# Patient Record
Sex: Female | Born: 1965
Health system: Southern US, Community
[De-identification: ages and names within clinical notes are randomized; demographics above are authoritative.]

## PROBLEM LIST (undated history)

## (undated) ENCOUNTER — Inpatient Hospital Stay: Admission: EM | Payer: Self-pay | Source: Home / Self Care

## (undated) DIAGNOSIS — M1990 Unspecified osteoarthritis, unspecified site: Secondary | ICD-10-CM

## (undated) DIAGNOSIS — F419 Anxiety disorder, unspecified: Secondary | ICD-10-CM

## (undated) DIAGNOSIS — J302 Other seasonal allergic rhinitis: Secondary | ICD-10-CM

## (undated) DIAGNOSIS — C4490 Unspecified malignant neoplasm of skin, unspecified: Secondary | ICD-10-CM

## (undated) DIAGNOSIS — F32A Depression, unspecified: Secondary | ICD-10-CM

## (undated) DIAGNOSIS — E039 Hypothyroidism, unspecified: Secondary | ICD-10-CM

## (undated) DIAGNOSIS — K219 Gastro-esophageal reflux disease without esophagitis: Secondary | ICD-10-CM

## (undated) DIAGNOSIS — K5190 Ulcerative colitis, unspecified, without complications: Secondary | ICD-10-CM

## (undated) DIAGNOSIS — I10 Essential (primary) hypertension: Principal | ICD-10-CM

## (undated) DIAGNOSIS — F329 Major depressive disorder, single episode, unspecified: Secondary | ICD-10-CM

## (undated) HISTORY — PX: DILATION AND CURETTAGE OF UTERUS: SHX78

## (undated) HISTORY — PX: EYE SURGERY: SHX253

## (undated) HISTORY — PX: ABDOMINAL HYSTERECTOMY: SHX81

## (undated) HISTORY — PX: COLONOSCOPY: SHX174

## (undated) HISTORY — DX: Essential (primary) hypertension: I10

---

## 1998-03-18 ENCOUNTER — Ambulatory Visit (HOSPITAL_COMMUNITY): Admission: RE | Admit: 1998-03-18 | Discharge: 1998-03-18 | Payer: Self-pay | Admitting: *Deleted

## 1998-05-03 ENCOUNTER — Ambulatory Visit (HOSPITAL_COMMUNITY): Admission: RE | Admit: 1998-05-03 | Discharge: 1998-05-03 | Payer: Self-pay | Admitting: Obstetrics and Gynecology

## 1999-06-22 ENCOUNTER — Other Ambulatory Visit: Admission: RE | Admit: 1999-06-22 | Discharge: 1999-06-22 | Payer: Self-pay | Admitting: Obstetrics and Gynecology

## 2000-08-28 ENCOUNTER — Other Ambulatory Visit: Admission: RE | Admit: 2000-08-28 | Discharge: 2000-08-28 | Payer: Self-pay | Admitting: Obstetrics and Gynecology

## 2001-11-25 ENCOUNTER — Other Ambulatory Visit: Admission: RE | Admit: 2001-11-25 | Discharge: 2001-11-25 | Payer: Self-pay | Admitting: Obstetrics and Gynecology

## 2002-12-16 ENCOUNTER — Other Ambulatory Visit: Admission: RE | Admit: 2002-12-16 | Discharge: 2002-12-16 | Payer: Self-pay | Admitting: Obstetrics and Gynecology

## 2004-01-26 ENCOUNTER — Other Ambulatory Visit: Admission: RE | Admit: 2004-01-26 | Discharge: 2004-01-26 | Payer: Self-pay | Admitting: Obstetrics and Gynecology

## 2004-10-12 ENCOUNTER — Observation Stay (HOSPITAL_COMMUNITY): Admission: RE | Admit: 2004-10-12 | Discharge: 2004-10-13 | Payer: Self-pay | Admitting: Obstetrics and Gynecology

## 2004-10-12 ENCOUNTER — Encounter (INDEPENDENT_AMBULATORY_CARE_PROVIDER_SITE_OTHER): Payer: Self-pay | Admitting: *Deleted

## 2005-01-30 ENCOUNTER — Other Ambulatory Visit: Admission: RE | Admit: 2005-01-30 | Discharge: 2005-01-30 | Payer: Self-pay | Admitting: Obstetrics and Gynecology

## 2005-09-25 ENCOUNTER — Ambulatory Visit (HOSPITAL_COMMUNITY): Admission: RE | Admit: 2005-09-25 | Discharge: 2005-09-25 | Payer: Self-pay | Admitting: Family Medicine

## 2009-09-29 ENCOUNTER — Ambulatory Visit (HOSPITAL_COMMUNITY): Admission: RE | Admit: 2009-09-29 | Discharge: 2009-09-29 | Payer: Self-pay | Admitting: Obstetrics and Gynecology

## 2010-05-07 HISTORY — PX: BLADDER SUSPENSION: SHX72

## 2010-07-24 LAB — CBC
HCT: 41.9 % (ref 36.0–46.0)
Hemoglobin: 14.4 g/dL (ref 12.0–15.0)
MCHC: 34.4 g/dL (ref 30.0–36.0)
MCV: 98 fL (ref 78.0–100.0)
Platelets: 261 10*3/uL (ref 150–400)
RBC: 4.27 MIL/uL (ref 3.87–5.11)
RDW: 14 % (ref 11.5–15.5)
WBC: 6.4 10*3/uL (ref 4.0–10.5)

## 2010-09-22 NOTE — Op Note (Signed)
NAMEKARLE, DESROSIER              ACCOUNT NO.:  [id number]   MEDICAL RECORD NO.:  [id number]          PATIENT TYPE:  OBV   LOCATION:  9399                          FACILITY:  [facility]   PHYSICIAN:  Juluis Mire, M.D.   DATE OF BIRTH:  1965-05-10   DATE OF PROCEDURE:  10/12/2004  DATE OF DISCHARGE:                                 OPERATIVE REPORT   PREOPERATIVE DIAGNOSES:  1.  Menorrhagia.  2.  Dysmenorrhea.  3.  Probable uterine adenomyosis.   POSTOPERATIVE DIAGNOSES:  1.  Menorrhagia.  2.  Dysmenorrhea.  3.  Probable uterine adenomyosis.   PROCEDURE:  Laparoscopically-assisted vaginal hysterectomy.   SURGEON:  Juluis Mire, M.D.   ASSISTANT:  Stann Mainland. Vincente Poli, M.D.   ANESTHESIA:  General endotracheal.   ESTIMATED BLOOD LOSS:  200 mL.   PACKS AND DRAINS:  None.   INTRAOPERATIVE BLOOD REPLACED:  None.   COMPLICATIONS:  None.   INDICATIONS:  As dictated in the history and physical.   PROCEDURE:  The patient was taken to the OR and placed in the supine  position.  After an adequate level of general endotracheal anesthesia  obtained, the patient was placed in the dorsal lithotomy position using the  Allen stirrups.  The abdomen, perineum and vagina were prepped out with  Betadine.  The bladder was emptied by in-and-out catheterization.  A Hulka  tenaculum was put in place and secured.  The patient draped in a sterile  field.  A subumbilical incision made with a knife.  A Veress needle was  introduced in the abdominal cavity.  The abdomen was insufflated with  approximately 3 L of carbon dioxide.  The operating laparoscope was  introduced with no evidence of injury to adjacent organs.  A 5 mm trocar was  put in place in the suprapubic area under direct visualization.  The uterus  was upper limits of normal size.  Minimal amounts of endometriosis noted  along the left pelvic sidewall.  The ovaries were unremarkable.  The upper  abdomen, including the liver, tip of the  gallbladder and both lateral  gutters including the appendix, was clear.  Using the Gyrus bipolar, the  right utero-ovarian pedicle was cauterized, incised.  The right tube and  mesosalpinx was cauterized, incised.  The right round ligament was  cauterized, incised, and part of the right broad ligament was separated.  Next we did the left side.  The left utero-ovarian pedicle was cauterized,  incised.  The left tube and mesosalpinx were cauterized, incised.  The left  round ligament was cauterized, incised, and we did extend it down to the  broad ligament.  At this point in time the abdomen was deflated of its  carbon dioxide, the laparoscope was removed, legs were repositioned.  The  Hulka tenaculum was then taken out, a weighted speculum put in.  The cervix  was grasped with a Jacobs tenaculum, the cul-de-sac was entered sharply.  The uterosacral ligaments were clamped, cut, and suture ligated with 0  Vicryl.  The reflection of the vaginal mucosa was then excised using the  Bovie, bladder  was dissected superiorly.  Paracervical tissue as clamped,  cut, and suture ligated with 0 Vicryl.  The vesicouterine space was  identified, entered sharply and retractors put in place to retract the  bladder superiorly.  Using the clamp, cut and tie technique with suture  ligature of 0 Vicryl, the parametrium was serially separated from the sides  of the uterus.  The uterus was then flipped.  The remaining pedicles were  clamped and cut.  Uterus and cervix were passed off the operative field.  Held pedicles were secured with free ties of 0 Vicryl.  A uterosacral  plication stitch of 0 Vicryl was put in place and secured.  The vaginal  mucosa was reapproximated in the midline with interrupted figure-of-eights  of 0 Vicryl.  A sponge on a sponge stick was placed in the vaginal vault.  A  Foley was placed to straight drain with retrieval of an adequate amount of  clear urine.  The weighted speculum had  been removed.  The patient's legs  were repositioned.  The laparoscope was reintroduced as the abdomen was  reinflated with carbon dioxide.  Areas of oozing on the vaginal cuff were  brought under control using the Gyrus.  With this we had good hemostasis at  all pedicles.  We thoroughly irrigated the pelvis.  No active bleeding was  noted.  The abdomen was deflated of its carbon dioxide, all trocars removed.  The subumbilical incision was closed with interrupted subcuticulars of 4-0  Vicryl, the suprapubic incision closed with Dermabond.  The patient taken  out of the dorsal lithotomy position, once alert and extubated transferred  to the recovery room in good condition.  Sponge, instrument and needle count  reported as correct by the circulating nurse x2.       JSM/MEDQ  D:  10/12/2004  T:  10/12/2004  Job:  [id number]

## 2010-09-22 NOTE — Discharge Summary (Signed)
[name], Robin Gardner              ACCOUNT NO.:  [id number]   MEDICAL RECORD NO.:  [id number]          PATIENT TYPE:  OBV   LOCATION:  9307                          FACILITY:  [facility]   PHYSICIAN:  Juluis Mire, M.D.   DATE OF BIRTH:  1965/10/23   DATE OF ADMISSION:  10/12/2004  DATE OF DISCHARGE:  10/13/2004                                 DISCHARGE SUMMARY   ADMITTING DIAGNOSES:  Uterine adenomyosis.   DISCHARGE DIAGNOSES:  Uterine adenomyosis.   OPERATION/PROCEDURE:  Laparoscopic assisted vaginal hysterectomy.   For complete history and physical see dictated note.   HOSPITAL COURSE:  Patient underwent the above noted surgery.  Pathology  pending.  Postoperative hemoglobin 13.3.  Discharged home on her first  postoperative day.  At that time she was tolerating a regular diet and  ambulating.  Her Foley would be discontinue and she was voiding without  difficulty.  There was no active vaginal bleeding.  Abdomen was soft.  Incision was clear and bowel sounds were active.   COMPLICATIONS:  None encountered during stay in hospital.  Patient  discharged home in stable condition.   DISPOSITION:  Routine postoperative instructions/orders given.  She is to  avoid heavy lifting, vaginal entrance, driving of a car.  She is to watch  for signs of infection, nausea, vomiting, increasing abdominal pain, or  active vaginal bleeding.  Discharged home on Percocet as she needs for pain.  Reassess in the office in one week.       JSM/MEDQ  D:  10/13/2004  T:  10/13/2004  Job:  [id number]

## 2010-09-22 NOTE — H&P (Signed)
NAME:  Robin Gardner, Robin Gardner              ACCOUNT NO.:  [id number]   MEDICAL RECORD NO.:  [id number]          PATIENT TYPE:  AMB   LOCATION:  SDC                           FACILITY:  [facility]   PHYSICIAN:  Juluis Mire, M.D.   DATE OF BIRTH:  [birth year]   DATE OF ADMISSION:  10/12/2004  DATE OF DISCHARGE:                                HISTORY & PHYSICAL   HISTORY:  The patient is a 45 year old gravida 3, para 2, abortus 1, married  female who presents for laparoscopic assisted vaginal hysterectomy, _____to  the present admission. Cycles have been regular. She has prolonged episodes  of bleeding. This has become limiting from the patient's stand-point. She  also had break-through bleeding on birth control pills. Some of this may  have been from the medication that she takes for ulcerative colitis. She has  been on Prednisone in the past. We tried using a [place] IUD, however despite  this she had continuous bleeding and pelvic pain. She was being actively  managed for hypertension at point in time and therefore we were unable to  proceed with the use of birth control pills. We did a saline infusion  ultrasound that was basically unremarkable except for the presence of the  IUD and findings consistent with probable adenomyosis. We had discussed the  various options including endometrial ablation versus attempts at other  hormonal management. The patient desires to proceed with laparoscopic  assisted vaginal hysterectomy for which she is admitted at the present time.   ALLERGIES:  No known drug allergies.   MEDICATIONS:  1.  Prednisone 5 mg every other day.  2.  Colazal 750 mg three tablets t.i.d.  3.  Diovan & hydrochlorothiazide 80 mg 1/2 tablet daily.  4.  Colocort 100 mg at h.s. every other day.  5.  Hyoscyamine 0.125 mg 1-2 tablets q.4h p.r.n.   PAST MEDICAL HISTORY:  1.  The usual childhood diseases.  2.  She is being managed for ulcerative colitis.   PAST SURGICAL HISTORY:  She had  one cesarean section and then a vaginal  delivery.   FAMILY HISTORY:  Noncontributory.   SOCIAL HISTORY:  No tobacco or alcohol use.   REVIEW OF SYSTEMS:  Noncontributory.   PHYSICAL EXAMINATION:  VITAL SIGNS:  The patient is afebrile with stable  vital signs.  HEENT:  Normocephalic. Pupils are equal, round and reactive to light and  accommodation.  Extraocular movements are intact. Sclerae and conjunctivae  are clear. Oropharynx clear.  NECK:  Without thyromegaly.  BREASTS:  No discreet masses.  LUNGS:  Clear.  CARDIAC SYSTEM:  Regular rate and rhythm without murmurs or gallops.  ABDOMEN:  Exam is benign. No mass, organomegaly or tenderness.  PELVIC:  Normal external genitalia. Vaginal mucosa is clear. Cervix  unremarkable. Uterus upper limits of normal size. Adnexa unremarkable.  EXTREMITIES:  Trace edema.  NEUROLOGIC EXAM:  Grossly within normal limits.   IMPRESSION:  1.  Menorrhagia with associated pelvic pain, probably adenomyosis.  2.  Hypertension.  3.  Ulcerative colitis.   PLAN:  The patient will undergo laparoscopic assisted vaginal hysterectomy.  Alternatives have been discussed. The risks of surgery have been discussed  including the risk of infection. The risk of hemorrhage. Required  transfusion. The risk of AIDS or hepatitis. The risk of injury to adjacent  organs including bladder, bowel or ureters that could require further  exploratory surgery. The risk of deep venous thrombosis and pulmonary  embolus. The patient expressed her understanding of indications and risks.       JSM/MEDQ  D:  10/12/2004  T:  10/12/2004  Job:  [id number]

## 2011-02-07 ENCOUNTER — Other Ambulatory Visit: Payer: Self-pay | Admitting: Dermatology

## 2011-06-15 ENCOUNTER — Other Ambulatory Visit: Payer: Self-pay | Admitting: Dermatology

## 2011-09-10 ENCOUNTER — Ambulatory Visit (HOSPITAL_COMMUNITY)
Admission: RE | Admit: 2011-09-10 | Discharge: 2011-09-10 | Disposition: A | Discharge status: Home / Self Care | Payer: PRIVATE HEALTH INSURANCE | Source: Ambulatory Visit | Attending: Family Medicine | Admitting: Family Medicine

## 2011-09-10 ENCOUNTER — Other Ambulatory Visit: Payer: Self-pay | Admitting: Family Medicine

## 2011-09-10 DIAGNOSIS — R053 Chronic cough: Secondary | ICD-10-CM

## 2011-09-10 DIAGNOSIS — R05 Cough: Secondary | ICD-10-CM

## 2011-09-10 DIAGNOSIS — I10 Essential (primary) hypertension: Secondary | ICD-10-CM | POA: Insufficient documentation

## 2011-09-10 DIAGNOSIS — R059 Cough, unspecified: Secondary | ICD-10-CM | POA: Insufficient documentation

## 2012-02-18 ENCOUNTER — Other Ambulatory Visit: Payer: Self-pay | Admitting: Gastroenterology

## 2012-02-21 ENCOUNTER — Emergency Department (HOSPITAL_COMMUNITY): Payer: PRIVATE HEALTH INSURANCE

## 2012-02-21 ENCOUNTER — Emergency Department (HOSPITAL_COMMUNITY)
Admission: EM | Admit: 2012-02-21 | Discharge: 2012-02-21 | Disposition: A | Discharge status: Home / Self Care | Payer: PRIVATE HEALTH INSURANCE | Attending: Emergency Medicine | Admitting: Emergency Medicine

## 2012-02-21 ENCOUNTER — Encounter (HOSPITAL_COMMUNITY): Payer: Self-pay | Admitting: Nurse Practitioner

## 2012-02-21 DIAGNOSIS — R109 Unspecified abdominal pain: Secondary | ICD-10-CM | POA: Insufficient documentation

## 2012-02-21 DIAGNOSIS — K5190 Ulcerative colitis, unspecified, without complications: Secondary | ICD-10-CM | POA: Insufficient documentation

## 2012-02-21 DIAGNOSIS — Z9889 Other specified postprocedural states: Secondary | ICD-10-CM | POA: Insufficient documentation

## 2012-02-21 DIAGNOSIS — K625 Hemorrhage of anus and rectum: Secondary | ICD-10-CM | POA: Insufficient documentation

## 2012-02-21 HISTORY — DX: Ulcerative colitis, unspecified, without complications: K51.90

## 2012-02-21 HISTORY — DX: Unspecified malignant neoplasm of skin, unspecified: C44.90

## 2012-02-21 LAB — CBC WITH DIFFERENTIAL/PLATELET
Eosinophils Absolute: 0.2 10*3/uL (ref 0.0–0.7)
Eosinophils Relative: 3 % (ref 0–5)
HCT: 39.3 % (ref 36.0–46.0)
Lymphs Abs: 0.8 10*3/uL (ref 0.7–4.0)
MCH: 31.3 pg (ref 26.0–34.0)
MCV: 91 fL (ref 78.0–100.0)
Monocytes Absolute: 0.7 10*3/uL (ref 0.1–1.0)
Monocytes Relative: 10 % (ref 3–12)
Platelets: 423 10*3/uL — ABNORMAL HIGH (ref 150–400)
RBC: 4.32 MIL/uL (ref 3.87–5.11)

## 2012-02-21 LAB — COMPREHENSIVE METABOLIC PANEL
BUN: 9 mg/dL (ref 6–23)
Calcium: 9.1 mg/dL (ref 8.4–10.5)
Creatinine, Ser: 0.69 mg/dL (ref 0.50–1.10)
GFR calc Af Amer: >90 mL/min (ref >90)
GFR calc non Af Amer: >90 mL/min (ref >90)
Glucose, Bld: 90 mg/dL (ref 70–99)
Sodium: 140 mEq/L (ref 135–145)
Total Protein: 7.5 g/dL (ref 6.0–8.3)

## 2012-02-21 LAB — POCT PREGNANCY, URINE: Preg Test, Ur: NEGATIVE

## 2012-02-21 NOTE — ED Notes (Signed)
Patient transported to X-ray 

## 2012-02-21 NOTE — ED Notes (Signed)
AIDET performed. 

## 2012-02-21 NOTE — ED Notes (Signed)
Pt states that on Monday she went in for a colonoscopy. Since then when she has a bowel movement she is actually passing mucus, frank blood, and some clots. Abdomen is tender to touch. LBM was this morning.

## 2012-02-21 NOTE — ED Notes (Signed)
Pt had endo/colonoscopy at [facility] on Monday for h/o ulcerative colitis. Reports since Tuesday constant abd pain and intermittent bright red blood with clots per rectum.

## 2012-02-21 NOTE — ED Provider Notes (Signed)
History     CSN: [id number]  Arrival date & time 02/21/12  1221   First MD Initiated Contact with Patient 02/21/12 1244      Chief Complaint  Patient presents with  . Rectal Bleeding  . Abdominal Pain    (Consider location/radiation/quality/duration/timing/severity/associated sxs/prior treatment) HPI Comments: Robin Gardner 46 y.o. female   The chief complaint is: Patient presents with:   Rectal Bleeding   Abdominal Pain    46 year old female presents to the emergency department. Status post colonoscopy on Monday, 02/18/2012. Patient states that since that time. She has had crampy, lower abdominal pain, and rectal bleeding with bright red blood. She is also been passing clots. She states that this is intermittent. She has a history of ulcerative colitis. States that her bowel movements have been normal. "For her." She states that stools are usually loose and have been such. She denies, fevers, chills, nausea, vomiting. She denies, chest pain, shortness of breath. She denies vaginal symptoms. She denies urinary symptoms. This is unlike previous flares of ulcerative colitis. Denies, lightheadedness, or racing or skipping of the heart. Sent by GI to rule out perforation of the abdomen.     The history is provided by the patient. No language interpreter was used.    Past Medical History  Diagnosis Date  . Ulcerative colitis   . Skin cancer     Past Surgical History  Procedure Date  . Abdominal hysterectomy     History reviewed. No pertinent family history.  History  Substance Use Topics  . Smoking status: Never Smoker   . Smokeless tobacco: Not on file  . Alcohol Use: Yes    OB History    Grav Para Term Preterm Abortions TAB SAB Ect Mult Living                  Review of Systems  Constitutional: Negative for fever and chills.  HENT: Negative for trouble swallowing.   Respiratory: Negative for chest tightness and shortness of breath.   Cardiovascular:  Negative for chest pain and palpitations.  Gastrointestinal: Positive for abdominal pain and anal bleeding. Negative for diarrhea and rectal pain.  Genitourinary: Negative for dysuria, frequency, hematuria, vaginal bleeding, vaginal discharge, vaginal pain and pelvic pain.  Musculoskeletal: Negative for myalgias and arthralgias.  Skin: Negative for rash.  Neurological: Negative for light-headedness and headaches.  All other systems reviewed and are negative.    Allergies  Augmentin and Adhesive  Home Medications   Current Outpatient Rx  Name Route Sig Dispense Refill  . VITAMIN B-COMPLEX PO TABS Oral Take 1 tablet by mouth daily.    Marland Kitchen VITAMIN D 1000 UNITS PO TABS Oral Take 1,000 Units by mouth daily.    Marland Kitchen HYDROCORTISONE 100 MG/60ML RE ENEM Rectal Place 100 mg rectally at bedtime. For 2 weeks starting on 02/19/12    . LEVOTHYROXINE SODIUM 75 MCG PO TABS Oral Take 75 mcg by mouth daily.    . MERCAPTOPURINE 50 MG PO TABS Oral Take 100 mg by mouth daily. Give on an empty stomach 1 hour before or 2 hours after meals. Caution: Chemotherapy.    . ONDANSETRON HCL 4 MG PO TABS Oral Take 4 mg by mouth every 8 (eight) hours as needed. For nausea    . PANTOPRAZOLE SODIUM 40 MG PO TBEC Oral Take 40 mg by mouth daily.    Marland Kitchen HEALTHY COLON PO Oral Take 1 tablet by mouth daily.    . SERTRALINE HCL 50 MG PO TABS  Oral Take 50 mg by mouth daily.      BP 152/88  Pulse 70  Temp 98.1 F (36.7 C) (Oral)  Resp 20  SpO2 98%  Physical Exam  Nursing note and vitals reviewed. Constitutional: She is oriented to person, place, and time. She appears well-developed and well-nourished. No distress.  HENT:  Head: Normocephalic and atraumatic.  Eyes: Conjunctivae normal are normal. No scleral icterus.  Neck: Normal range of motion.  Cardiovascular: Normal rate, regular rhythm and normal heart sounds.  Exam reveals no gallop and no friction rub.   No murmur heard. Pulmonary/Chest: Effort normal and breath  sounds normal. No respiratory distress.  Abdominal: Soft. Bowel sounds are normal. She exhibits no distension and no mass. There is no tenderness. There is no guarding.  Musculoskeletal: Normal range of motion.  Neurological: She is alert and oriented to person, place, and time.  Skin: Skin is warm and dry. She is not diaphoretic.    ED Course  Procedures (including critical care time) Results for orders placed during the hospital encounter of 02/21/12  CBC WITH DIFFERENTIAL      Component Value Range   WBC 7.4  4.0 - 10.5 K/uL   RBC 4.32  3.87 - 5.11 MIL/uL   Hemoglobin 13.5  12.0 - 15.0 g/dL   HCT 40.9  81.1 - 91.4 %   MCV 91.0  78.0 - 100.0 fL   MCH 31.3  26.0 - 34.0 pg   MCHC 34.4  30.0 - 36.0 g/dL   RDW 78.2  95.6 - 21.3 %   Platelets 423 (*) 150 - 400 K/uL   Neutrophils Relative 76  43 - 77 %   Neutro Abs 5.6  1.7 - 7.7 K/uL   Lymphocytes Relative 11 (*) 12 - 46 %   Lymphs Abs 0.8  0.7 - 4.0 K/uL   Monocytes Relative 10  3 - 12 %   Monocytes Absolute 0.7  0.1 - 1.0 K/uL   Eosinophils Relative 3  0 - 5 %   Eosinophils Absolute 0.2  0.0 - 0.7 K/uL   Basophils Relative 0  0 - 1 %   Basophils Absolute 0.0  0.0 - 0.1 K/uL  COMPREHENSIVE METABOLIC PANEL      Component Value Range   Sodium 140  135 - 145 mEq/L   Potassium 3.6  3.5 - 5.1 mEq/L   Chloride 103  96 - 112 mEq/L   CO2 26  19 - 32 mEq/L   Glucose, Bld 90  70 - 99 mg/dL   BUN 9  6 - 23 mg/dL   Creatinine, Ser 0.86  0.50 - 1.10 mg/dL   Calcium 9.1  8.4 - 57.8 mg/dL   Total Protein 7.5  6.0 - 8.3 g/dL   Albumin 3.9  3.5 - 5.2 g/dL   AST 21  0 - 37 U/L   ALT 20  0 - 35 U/L   Alkaline Phosphatase 74  39 - 117 U/L   Total Bilirubin 0.2 (*) 0.3 - 1.2 mg/dL   GFR calc non Af Amer >90  >90 mL/min   GFR calc Af Amer >90  >90 mL/min  POCT PREGNANCY, URINE      Component Value Range   Preg Test, Ur NEGATIVE  NEGATIVE  POCT PREGNANCY, URINE      Component Value Range   Preg Test, Ur NEGATIVE  NEGATIVE     No  results found.   1. Abdominal  pain, other specified site  2. Rectal bleeding   3. S/P colonoscopy       MDM  Patient with 2 poct preg tests.  1 pos, 1 neg.  Hysterectomy done in 2006.     Patient with negative acute abdominal films. No perforations seen. Bleeding likely from biopsy sites. Will d/c and patient may f/u with GI. Discussed reasons to seek immediate care. Patient expresses understanding and agrees with plan.         Arthor Captain, PA-C 03/03/12 2057

## 2012-03-04 NOTE — ED Provider Notes (Signed)
Medical screening examination/treatment/procedure(s) were performed by non-physician practitioner and as supervising physician I was immediately available for consultation/collaboration.  Ethelda Chick, MD 03/04/12 1200

## 2012-06-26 ENCOUNTER — Other Ambulatory Visit (HOSPITAL_COMMUNITY): Payer: Self-pay | Admitting: Family Medicine

## 2012-06-26 ENCOUNTER — Ambulatory Visit (HOSPITAL_COMMUNITY)
Admission: RE | Admit: 2012-06-26 | Discharge: 2012-06-26 | Disposition: A | Discharge status: Home / Self Care | Payer: BC Managed Care – PPO | Source: Ambulatory Visit | Attending: Family Medicine | Admitting: Family Medicine

## 2012-06-26 DIAGNOSIS — M545 Low back pain, unspecified: Secondary | ICD-10-CM

## 2012-07-22 ENCOUNTER — Telehealth: Payer: Self-pay | Admitting: Family Medicine

## 2012-07-22 NOTE — Telephone Encounter (Signed)
Pt had xray on 2-20. Results in epic.please review

## 2012-07-22 NOTE — Telephone Encounter (Signed)
X ray was completely normal. If still experiencing sig pain, shoulc sched a f-u visit.

## 2012-07-22 NOTE — Telephone Encounter (Signed)
Pt notified of xray result. She will call back to make appointment.

## 2012-07-22 NOTE — Telephone Encounter (Signed)
Pt is checking on her xray for her lower back

## 2012-08-19 ENCOUNTER — Other Ambulatory Visit: Payer: Self-pay | Admitting: *Deleted

## 2012-08-19 MED ORDER — LEVOTHYROXINE SODIUM 75 MCG PO TABS: 75.0000 ug | ORAL_TABLET | Freq: Every day | ORAL | Status: DC

## 2012-10-15 ENCOUNTER — Encounter: Payer: Self-pay | Admitting: Family Medicine

## 2012-10-15 ENCOUNTER — Ambulatory Visit (INDEPENDENT_AMBULATORY_CARE_PROVIDER_SITE_OTHER): Payer: BC Managed Care – PPO | Admitting: Family Medicine

## 2012-10-15 VITALS — BP 122/86 | Temp 98.6°F | Wt 192.0 lb

## 2012-10-15 DIAGNOSIS — J019 Acute sinusitis, unspecified: Secondary | ICD-10-CM

## 2012-10-15 MED ORDER — LEVOFLOXACIN 500 MG PO TABS: 500.0000 mg | ORAL_TABLET | Freq: Every day | ORAL | Status: DC

## 2012-10-15 NOTE — Patient Instructions (Addendum)
DASH Diet  The DASH diet stands for "Dietary Approaches to Stop Hypertension." It is a healthy eating plan that has been shown to reduce high blood pressure (hypertension) in as little as 14 days, while also possibly providing other significant health benefits. These other health benefits include reducing the risk of breast cancer after menopause and reducing the risk of type 2 diabetes, heart disease, colon cancer, and stroke. Health benefits also include weight loss and slowing kidney failure in patients with chronic kidney disease.   DIET GUIDELINES  · Limit salt (sodium). Your diet should contain less than 1500 mg of sodium daily.  · Limit refined or processed carbohydrates. Your diet should include mostly whole grains. Desserts and added sugars should be used sparingly.  · Include small amounts of heart-healthy fats. These types of fats include nuts, oils, and tub margarine. Limit saturated and trans fats. These fats have been shown to be harmful in the body.  CHOOSING FOODS   The following food groups are based on a 2000 calorie diet. See your Registered Dietitian for individual calorie needs.  Grains and Grain Products (6 to 8 servings daily)  · Eat More Often: Whole-wheat bread, brown rice, whole-grain or wheat pasta, quinoa, popcorn without added fat or salt (air popped).  · Eat Less Often: White bread, white pasta, white rice, cornbread.  Vegetables (4 to 5 servings daily)  · Eat More Often: Fresh, frozen, and canned vegetables. Vegetables may be raw, steamed, roasted, or grilled with a minimal amount of fat.  · Eat Less Often/Avoid: Creamed or fried vegetables. Vegetables in a cheese sauce.  Fruit (4 to 5 servings daily)  · Eat More Often: All fresh, canned (in natural juice), or frozen fruits. Dried fruits without added sugar. One hundred percent fruit juice (½ cup [237 mL] daily).  · Eat Less Often: Dried fruits with added sugar. Canned fruit in light or heavy syrup.  Lean Meats, Fish, and Poultry (2  servings or less daily. One serving is 3 to 4 oz [85-114 g]).  · Eat More Often: Ninety percent or leaner ground beef, tenderloin, sirloin. Round cuts of beef, chicken breast, turkey breast. All fish. Grill, bake, or broil your meat. Nothing should be fried.  · Eat Less Often/Avoid: Fatty cuts of meat, turkey, or chicken leg, thigh, or wing. Fried cuts of meat or fish.  Dairy (2 to 3 servings)  · Eat More Often: Low-fat or fat-free milk, low-fat plain or light yogurt, reduced-fat or part-skim cheese.  · Eat Less Often/Avoid: Milk (whole, 2%). Whole milk yogurt. Full-fat cheeses.  Nuts, Seeds, and Legumes (4 to 5 servings per week)  · Eat More Often: All without added salt.  · Eat Less Often/Avoid: Salted nuts and seeds, canned beans with added salt.  Fats and Sweets (limited)  · Eat More Often: Vegetable oils, tub margarines without trans fats, sugar-free gelatin. Mayonnaise and salad dressings.  · Eat Less Often/Avoid: Coconut oils, palm oils, butter, stick margarine, cream, half and half, cookies, candy, pie.  FOR MORE INFORMATION  The Dash Diet Eating Plan: www.dashdiet.org  Document Released: 04/12/2011 Document Revised: 07/16/2011 Document Reviewed: 04/12/2011  [employer]® Patient Information ©2014 [employer].

## 2012-10-15 NOTE — Progress Notes (Signed)
  Subjective:    Patient ID: Robin Gardner, female    DOB: Oct 09, 1965, 47 y.o.   MRN: [id number]  Otalgia  There is pain in the left ear. This is a new problem. The current episode started today. The problem has been gradually improving. Associated symptoms include coughing, headaches, hearing loss and a sore throat.      Review of Systems  HENT: Positive for hearing loss, ear pain and sore throat.   Respiratory: Positive for cough.   Neurological: Positive for headaches.  Patient multiple days of head congestion drainage sinus pressure not feeling good with left ear pain discomfort denies wheezing shortness of breath. PMH benign does not smoke.     Objective:   Physical Exam  On today's exam left eardrum normal right eardrums normal moderate sinus tenderness throat is normal neck is supple lungs are clear heart is regular.      Assessment & Plan:  Acute sinusitis-antibiotics prescribed. Followup if progressive troubles warm. Warning signs were discussed. Should be gradually better over the course of next several days. Should be perfectly fine to do her foot surgery in the near future.

## 2012-11-03 ENCOUNTER — Telehealth: Payer: Self-pay | Admitting: Family Medicine

## 2012-11-03 NOTE — Telephone Encounter (Signed)
error 

## 2012-11-05 ENCOUNTER — Other Ambulatory Visit: Payer: Self-pay | Admitting: Podiatry

## 2012-11-06 ENCOUNTER — Encounter: Payer: Self-pay | Admitting: Nurse Practitioner

## 2012-11-06 ENCOUNTER — Ambulatory Visit (INDEPENDENT_AMBULATORY_CARE_PROVIDER_SITE_OTHER): Payer: BC Managed Care – PPO | Admitting: Nurse Practitioner

## 2012-11-06 VITALS — BP 122/80 | HR 70 | Wt 189.2 lb

## 2012-11-06 DIAGNOSIS — K219 Gastro-esophageal reflux disease without esophagitis: Secondary | ICD-10-CM

## 2012-11-06 DIAGNOSIS — E039 Hypothyroidism, unspecified: Secondary | ICD-10-CM

## 2012-11-06 DIAGNOSIS — J309 Allergic rhinitis, unspecified: Secondary | ICD-10-CM

## 2012-11-06 DIAGNOSIS — Z0000 Encounter for general adult medical examination without abnormal findings: Secondary | ICD-10-CM

## 2012-11-06 DIAGNOSIS — J300 Vasomotor rhinitis: Secondary | ICD-10-CM

## 2012-11-06 MED ORDER — FLUTICASONE PROPIONATE 50 MCG/ACT NA SUSP: 2.0000 | Freq: Every day | NASAL | Status: DC

## 2012-11-06 NOTE — Patient Instructions (Addendum)
OTC antihistamine Zaditor eye drops

## 2012-11-06 NOTE — Addendum Note (Signed)
Addended by: Agnes Brightbill on: 11/06/2012 08:31 AM   Modules accepted: Orders  

## 2012-11-09 DIAGNOSIS — E039 Hypothyroidism, unspecified: Secondary | ICD-10-CM | POA: Insufficient documentation

## 2012-11-09 DIAGNOSIS — K219 Gastro-esophageal reflux disease without esophagitis: Secondary | ICD-10-CM | POA: Insufficient documentation

## 2012-11-09 NOTE — Progress Notes (Signed)
Subjective:  Presents with request for recheck in her thyroid level. No difficulty swallowing. Slight tickle in her throat. Postnasal drainage. Clearing her throat a lot. Reflux well controlled on Protonix. No fever. No abdominal pain. Occasional cough mainly in the morning and at nighttime. No sore throat. No ear pain. No headache. No wheezing. Itchy watery eyes. Minimal caffeine intake. Nonsmoker. No alcohol use. Objective:   BP 122/80  Pulse 70  Wt 189 lb 4 oz (85.843 kg) NAD. Alert, oriented. Left TM obscured with cerumen. Right TM clear effusion. Pharynx nonerythematous with PND noted. Neck supple with mild soft nontender adenopathy. Lungs clear. Heart regular rate rhythm. Abdomen soft nondistended nontender. Thyroid normal limit to palpation and nontender.  Assessment:GERD (gastroesophageal reflux disease)  Vasomotor rhinitis  Hypothyroidism - Plan: TSH, TSH  Routine general medical examination at a health care facility - Plan: Basic metabolic panel, Lipid panel, Basic metabolic panel, Lipid panel  Plan: Meds ordered this encounter  Medications  . fluticasone (FLONASE) 50 MCG/ACT nasal spray    Sig: Place 2 sprays into the nose daily.    Dispense:  16 g    Refill:  5    Order Specific Question:  Supervising Provider    Answer:  Merlyn Albert [[id number]]   OTC antihistamines and Zaditor eyedrops as directed. Continue Protonix as directed. Routine lab work ordered. Recheck here in a few months, call back sooner if any problems.

## 2012-11-09 NOTE — Assessment & Plan Note (Signed)
Labwork pending  

## 2012-11-09 NOTE — Assessment & Plan Note (Signed)
Symptoms stable on Protonix.

## 2012-11-13 ENCOUNTER — Encounter (HOSPITAL_COMMUNITY): Payer: Self-pay | Admitting: Pharmacy Technician

## 2012-11-14 LAB — LIPID PANEL
Cholesterol: 189 mg/dL (ref 0–200)
HDL: 30 mg/dL — ABNORMAL LOW (ref >39)
Total CHOL/HDL Ratio: 6.3 Ratio

## 2012-11-14 LAB — BASIC METABOLIC PANEL
Calcium: 9.1 mg/dL (ref 8.4–10.5)
Creat: 0.79 mg/dL (ref 0.50–1.10)
Sodium: 139 mEq/L (ref 135–145)

## 2012-11-14 LAB — TSH: TSH: 2.882 u[IU]/mL (ref 0.350–4.500)

## 2012-11-18 ENCOUNTER — Other Ambulatory Visit (HOSPITAL_COMMUNITY): Payer: PRIVATE HEALTH INSURANCE

## 2012-11-18 ENCOUNTER — Ambulatory Visit (HOSPITAL_COMMUNITY): Payer: PRIVATE HEALTH INSURANCE | Admitting: Physical Therapy

## 2012-11-18 ENCOUNTER — Encounter: Payer: Self-pay | Admitting: Nurse Practitioner

## 2012-11-18 NOTE — Patient Instructions (Addendum)
    ATHENE SCHUHMACHER  11/18/2012   Your procedure is scheduled on:  11/27/2012  Report to [facility] at  615  AM.  Call this number if you have problems the morning of surgery: [phone]   Remember:   Do not eat food or drink liquids after midnight.   Take these medicines the morning of surgery with A SIP OF WATER: synthroid, mercaptopurine,zofran, protonix, zolft. Take your flonase before you come.   Do not wear jewelry, make-up or nail polish.  Do not wear lotions, powders, or perfumes.   Do not shave 48 hours prior to surgery. Men may shave face and neck.  Do not bring valuables to the hospital.  [facility] is not responsible for any belongings or valuables.  Contacts, dentures or bridgework may not be worn into surgery.  Leave suitcase in the car. After surgery it may be brought to your room.  For patients admitted to the hospital, checkout time is 11:00 AM the day of discharge.   Patients discharged the day of surgery will not be allowed to drive home.  Name and phone number of your driver: family Special Instructions: Shower using CHG 2 nights before surgery and the night before surgery.  If you shower the day of surgery use CHG.  Use special wash - you have one bottle of CHG for all showers.  You should use approximately 1/3 of the bottle for each shower.   Please read over the following fact sheets that you were given: Pain Booklet, Coughing and Deep Breathing, Surgical Site Infection Prevention, Anesthesia Post-op Instructions and Care and Recovery After Surgery PATIENT INSTRUCTIONS POST-ANESTHESIA  IMMEDIATELY FOLLOWING SURGERY:  Do not drive or operate machinery for the first twenty four hours after surgery.  Do not make any important decisions for twenty four hours after surgery or while taking narcotic pain medications or sedatives.  If you develop intractable nausea and vomiting or a severe headache please notify your doctor immediately.  FOLLOW-UP:  Please make an  appointment with your surgeon as instructed. You do not need to follow up with anesthesia unless specifically instructed to do so.  WOUND CARE INSTRUCTIONS (if applicable):  Keep a dry clean dressing on the anesthesia/puncture wound site if there is drainage.  Once the wound has quit draining you may leave it open to air.  Generally you should leave the bandage intact for twenty four hours unless there is drainage.  If the epidural site drains for more than 36-48 hours please call the anesthesia department.  QUESTIONS?:  Please feel free to call your physician or the hospital operator if you have any questions, and they will be happy to assist you.

## 2012-11-19 ENCOUNTER — Ambulatory Visit (HOSPITAL_COMMUNITY): Payer: BC Managed Care – PPO | Admitting: Physical Therapy

## 2012-11-19 ENCOUNTER — Encounter (HOSPITAL_COMMUNITY): Payer: Self-pay

## 2012-11-19 ENCOUNTER — Ambulatory Visit (HOSPITAL_COMMUNITY)
Admission: RE | Admit: 2012-11-19 | Discharge: 2012-11-19 | Disposition: A | Discharge status: Home / Self Care | Payer: BC Managed Care – PPO | Source: Ambulatory Visit | Attending: Podiatry | Admitting: Podiatry

## 2012-11-19 ENCOUNTER — Encounter (HOSPITAL_COMMUNITY)
Admission: RE | Admit: 2012-11-19 | Discharge: 2012-11-19 | Disposition: A | Discharge status: Home / Self Care | Payer: BC Managed Care – PPO | Source: Ambulatory Visit | Attending: Podiatry | Admitting: Podiatry

## 2012-11-19 HISTORY — DX: Gastro-esophageal reflux disease without esophagitis: K21.9

## 2012-11-19 HISTORY — DX: Hypothyroidism, unspecified: E03.9

## 2012-11-19 HISTORY — DX: Other seasonal allergic rhinitis: J30.2

## 2012-11-19 LAB — HEMOGLOBIN AND HEMATOCRIT, BLOOD
HCT: 40.5 % (ref 36.0–46.0)
Hemoglobin: 13.8 g/dL (ref 12.0–15.0)

## 2012-11-27 ENCOUNTER — Ambulatory Visit (HOSPITAL_COMMUNITY)
Admission: RE | Admit: 2012-11-27 | Discharge: 2012-11-27 | Disposition: A | Discharge status: Home / Self Care | Payer: BC Managed Care – PPO | Source: Ambulatory Visit | Attending: Podiatry | Admitting: Podiatry

## 2012-11-27 ENCOUNTER — Encounter (HOSPITAL_COMMUNITY): Payer: Self-pay | Admitting: Anesthesiology

## 2012-11-27 ENCOUNTER — Encounter (HOSPITAL_COMMUNITY)
Admission: RE | Disposition: A | Discharge status: Home / Self Care | Payer: Self-pay | Source: Ambulatory Visit | Attending: Podiatry

## 2012-11-27 ENCOUNTER — Ambulatory Visit (HOSPITAL_COMMUNITY): Payer: BC Managed Care – PPO

## 2012-11-27 ENCOUNTER — Encounter (HOSPITAL_COMMUNITY): Payer: Self-pay | Admitting: *Deleted

## 2012-11-27 ENCOUNTER — Ambulatory Visit (HOSPITAL_COMMUNITY): Payer: BC Managed Care – PPO | Admitting: Anesthesiology

## 2012-11-27 DIAGNOSIS — M2010 Hallux valgus (acquired), unspecified foot: Secondary | ICD-10-CM | POA: Insufficient documentation

## 2012-11-27 DIAGNOSIS — Z01812 Encounter for preprocedural laboratory examination: Secondary | ICD-10-CM | POA: Insufficient documentation

## 2012-11-27 DIAGNOSIS — M24573 Contracture, unspecified ankle: Secondary | ICD-10-CM | POA: Insufficient documentation

## 2012-11-27 DIAGNOSIS — M2011 Hallux valgus (acquired), right foot: Secondary | ICD-10-CM

## 2012-11-27 HISTORY — PX: OSTECTOMY: SHX6439

## 2012-11-27 HISTORY — PX: AIKEN OSTEOTOMY: SHX6331

## 2012-11-27 SURGERY — OSTECTOMY
Anesthesia: Monitor Anesthesia Care | Site: Foot | Laterality: Right | Wound class: Clean

## 2012-11-27 MED ORDER — MIDAZOLAM HCL 2 MG/2ML IJ SOLN
INTRAMUSCULAR | Status: AC
  Filled 2012-11-27: qty 2

## 2012-11-27 MED ORDER — MIDAZOLAM HCL 2 MG/2ML IJ SOLN
1.0000 mg | INTRAMUSCULAR | Status: DC | PRN
  Administered 2012-11-27: 2 mg via INTRAVENOUS

## 2012-11-27 MED ORDER — FENTANYL CITRATE 0.05 MG/ML IJ SOLN
INTRAMUSCULAR | Status: AC
  Filled 2012-11-27: qty 2

## 2012-11-27 MED ORDER — ONDANSETRON HCL 4 MG/2ML IJ SOLN
INTRAMUSCULAR | Status: AC
  Filled 2012-11-27: qty 2

## 2012-11-27 MED ORDER — FENTANYL CITRATE 0.05 MG/ML IJ SOLN
25.0000 ug | INTRAMUSCULAR | Status: AC
  Administered 2012-11-27 (×2): 25 ug via INTRAVENOUS

## 2012-11-27 MED ORDER — MIDAZOLAM HCL 5 MG/5ML IJ SOLN
INTRAMUSCULAR | Status: DC | PRN
  Administered 2012-11-27 (×2): 1 mg via INTRAVENOUS
  Administered 2012-11-27: 2 mg via INTRAVENOUS

## 2012-11-27 MED ORDER — PROPOFOL INFUSION 10 MG/ML OPTIME
INTRAVENOUS | Status: DC | PRN
  Administered 2012-11-27: 30 ug/kg/min via INTRAVENOUS
  Administered 2012-11-27: 35 ug/kg/min via INTRAVENOUS

## 2012-11-27 MED ORDER — CLINDAMYCIN PHOSPHATE 600 MG/50ML IV SOLN
600.0000 mg | Freq: Once | INTRAVENOUS | Status: AC
  Administered 2012-11-27: 900 mg via INTRAVENOUS

## 2012-11-27 MED ORDER — ONDANSETRON HCL 4 MG/2ML IJ SOLN: 4.0000 mg | Freq: Once | INTRAMUSCULAR | Status: DC | PRN

## 2012-11-27 MED ORDER — ONDANSETRON HCL 4 MG/2ML IJ SOLN: 4.0000 mg | Freq: Once | INTRAMUSCULAR | Status: DC

## 2012-11-27 MED ORDER — PROPOFOL 10 MG/ML IV EMUL
INTRAVENOUS | Status: AC
  Filled 2012-11-27: qty 20

## 2012-11-27 MED ORDER — FENTANYL CITRATE 0.05 MG/ML IJ SOLN
INTRAMUSCULAR | Status: DC | PRN
  Administered 2012-11-27: 50 ug via INTRAVENOUS
  Administered 2012-11-27 (×2): 25 ug via INTRAVENOUS

## 2012-11-27 MED ORDER — FENTANYL CITRATE 0.05 MG/ML IJ SOLN
25.0000 ug | INTRAMUSCULAR | Status: DC | PRN
  Administered 2012-11-27 (×2): 50 ug via INTRAVENOUS

## 2012-11-27 MED ORDER — SODIUM CHLORIDE 0.9 % IR SOLN
Status: DC | PRN
  Administered 2012-11-27: 1000 mL

## 2012-11-27 MED ORDER — CLINDAMYCIN PHOSPHATE 600 MG/50ML IV SOLN
INTRAVENOUS | Status: AC
  Filled 2012-11-27: qty 50

## 2012-11-27 MED ORDER — BUPIVACAINE HCL (PF) 0.5 % IJ SOLN
INTRAMUSCULAR | Status: DC | PRN
  Administered 2012-11-27: 20 mL

## 2012-11-27 MED ORDER — LACTATED RINGERS IV SOLN
INTRAVENOUS | Status: DC
  Administered 2012-11-27: 07:00:00 via INTRAVENOUS

## 2012-11-27 MED ORDER — BUPIVACAINE HCL (PF) 0.5 % IJ SOLN
INTRAMUSCULAR | Status: AC
  Filled 2012-11-27: qty 30

## 2012-11-27 SURGICAL SUPPLY — 68 items
APL SKNCLS STERI-STRIP NONHPOA (GAUZE/BANDAGES/DRESSINGS) ×1
BAG HAMPER (MISCELLANEOUS) ×2 IMPLANT
BANDAGE CONFORM 2  STR LF (GAUZE/BANDAGES/DRESSINGS) ×2 IMPLANT
BANDAGE ELASTIC 4 VELCRO NS (GAUZE/BANDAGES/DRESSINGS) ×2 IMPLANT
BANDAGE ESMARK 4X12 BL STRL LF (DISPOSABLE) ×1 IMPLANT
BANDAGE GAUZE ELAST BULKY 4 IN (GAUZE/BANDAGES/DRESSINGS) ×2 IMPLANT
BENZOIN TINCTURE PRP APPL 2/3 (GAUZE/BANDAGES/DRESSINGS) ×2 IMPLANT
BIT DRILL CANN 2.7 (BIT) ×2
BIT DRILL SRG 2.7XCANN AO CPLG (BIT) IMPLANT
BIT DRL SRG 2.7XCANN AO CPLNG (BIT) ×1
BLADE AVERAGE 25X9 (BLADE) ×2 IMPLANT
BLADE SURG 15 STRL LF DISP TIS (BLADE) ×2 IMPLANT
BLADE SURG 15 STRL SS (BLADE) ×8
BNDG CMPR 12X4 ELC STRL LF (DISPOSABLE) ×1
BNDG ESMARK 4X12 BLUE STRL LF (DISPOSABLE) ×2
CHLORAPREP W/TINT 26ML (MISCELLANEOUS) ×2 IMPLANT
CLIP EZ FIXATION 10X10X10 (Staple) ×1 IMPLANT
CLOTH BEACON ORANGE TIMEOUT ST (SAFETY) ×2 IMPLANT
COVER LIGHT HANDLE STERIS (MISCELLANEOUS) ×4 IMPLANT
CUFF TOURNIQUET SINGLE 18IN (TOURNIQUET CUFF) ×1 IMPLANT
DECANTER SPIKE VIAL GLASS SM (MISCELLANEOUS) ×2 IMPLANT
DRAPE OEC MINIVIEW 54X84 (DRAPES) ×2 IMPLANT
DRSG ADAPTIC 3X8 NADH LF (GAUZE/BANDAGES/DRESSINGS) ×2 IMPLANT
DURA STEPPER LG (CAST SUPPLIES) IMPLANT
DURA STEPPER MED (CAST SUPPLIES) ×1 IMPLANT
DURA STEPPER SML (CAST SUPPLIES) IMPLANT
DURA STEPPER XL (SOFTGOODS) IMPLANT
ELECT REM PT RETURN 9FT ADLT (ELECTROSURGICAL) ×2
ELECTRODE REM PT RTRN 9FT ADLT (ELECTROSURGICAL) ×1 IMPLANT
GAUZE KERLIX 2  STERILE LF (GAUZE/BANDAGES/DRESSINGS) ×1 IMPLANT
GLOVE BIO SURGEON STRL SZ7.5 (GLOVE) ×2 IMPLANT
GLOVE BIOGEL PI IND STRL 7.0 (GLOVE) IMPLANT
GLOVE BIOGEL PI INDICATOR 7.0 (GLOVE) ×2
GLOVE EXAM NITRILE MD LF STRL (GLOVE) ×1 IMPLANT
GLOVE SKINSENSE NS SZ6.5 (GLOVE) ×1
GLOVE SKINSENSE NS SZ7.0 (GLOVE) ×2
GLOVE SKINSENSE NS SZ7.5 (GLOVE) ×1
GLOVE SKINSENSE STRL SZ6.5 (GLOVE) IMPLANT
GLOVE SKINSENSE STRL SZ7.0 (GLOVE) IMPLANT
GLOVE SKINSENSE STRL SZ7.5 (GLOVE) IMPLANT
GOWN STRL REIN XL XLG (GOWN DISPOSABLE) ×6 IMPLANT
K-WIRE ORTHOPEDIC 1.4X150L (WIRE) ×4
KIT ROOM TURNOVER AP CYSTO (KITS) ×2 IMPLANT
KWIRE ORTHOPEDIC 1.4X150L (WIRE) IMPLANT
MANIFOLD NEPTUNE II (INSTRUMENTS) ×2 IMPLANT
NDL HYPO 27GX1-1/4 (NEEDLE) ×3 IMPLANT
NEEDLE HYPO 27GX1-1/4 (NEEDLE) ×4 IMPLANT
NS IRRIG 1000ML POUR BTL (IV SOLUTION) ×2 IMPLANT
PACK BASIC LIMB (CUSTOM PROCEDURE TRAY) ×2 IMPLANT
PAD ARMBOARD 7.5X6 YLW CONV (MISCELLANEOUS) ×2 IMPLANT
PLATE UTILITY 3H (Plate) ×1 IMPLANT
RASP SM TEAR CROSS CUT (RASP) IMPLANT
SCREW CANNULATED 4.0X18MM (Screw) ×2 IMPLANT
SCREW LOCK T8 16X3.5X STRDR (Screw) IMPLANT
SCREW LOCK T8 20X3.5X STRDR (Screw) IMPLANT
SCREW LOCKING 3.5X16MM (Screw) ×2 IMPLANT
SCREW LOCKING 3.5X20MM (Screw) ×2 IMPLANT
SCREW NON LOCK 3.5X16MM (Screw) ×1 IMPLANT
SET BASIN LINEN APH (SET/KITS/TRAYS/PACK) ×2 IMPLANT
SPONGE GAUZE 4X4 12PLY (GAUZE/BANDAGES/DRESSINGS) ×2 IMPLANT
SPONGE LAP 18X18 X RAY DECT (DISPOSABLE) ×2 IMPLANT
STRIP CLOSURE SKIN 1/2X4 (GAUZE/BANDAGES/DRESSINGS) ×3 IMPLANT
SUT PROLENE 4 0 PS 2 18 (SUTURE) ×2 IMPLANT
SUT VIC AB 2-0 CT2 27 (SUTURE) ×2 IMPLANT
SUT VIC AB 4-0 PS2 27 (SUTURE) ×3 IMPLANT
SUT VICRYL AB 3-0 FS1 BRD 27IN (SUTURE) ×1 IMPLANT
SYR CONTROL 10ML LL (SYRINGE) ×4 IMPLANT
TOWEL OR 17X26 4PK STRL BLUE (TOWEL DISPOSABLE) ×1 IMPLANT

## 2012-11-27 NOTE — Anesthesia Postprocedure Evaluation (Signed)
  Anesthesia Post-op Note  Patient: Robin Gardner  Procedure(s) Performed: Procedure(s): LUDLOFF OSTEOTOMY RIGHT FOOT (Right) AIKEN OSTEOTOMY RIGHT FOOT (Right)  Patient Location: PACU  Anesthesia Type:MAC  Level of Consciousness: awake, alert , oriented and patient cooperative  Airway and Oxygen Therapy: Patient Spontanous Breathing  Post-op Pain: 2 /10, mild  Post-op Assessment: Post-op Vital signs reviewed, Patient's Cardiovascular Status Stable, Respiratory Function Stable, Patent Airway, No signs of Nausea or vomiting and Pain level controlled  Post-op Vital Signs: Reviewed and stable  Complications: No apparent anesthesia complications

## 2012-11-27 NOTE — Transfer of Care (Signed)
Immediate Anesthesia Transfer of Care Note  Patient: Robin Gardner  Procedure(s) Performed: Procedure(s): LUDLOFF OSTEOTOMY RIGHT FOOT (Right) AIKEN OSTEOTOMY RIGHT FOOT (Right)  Patient Location: PACU  Anesthesia Type:MAC  Level of Consciousness: awake and patient cooperative  Airway & Oxygen Therapy: Patient Spontanous Breathing and Patient connected to face mask oxygen  Post-op Assessment: Report given to PACU RN, Post -op Vital signs reviewed and stable and Patient moving all extremities  Post vital signs: Reviewed and stable  Complications: No apparent anesthesia complications

## 2012-11-27 NOTE — Brief Op Note (Addendum)
BRIEF OPERATIVE NOTE  SURGEON:   Dallas Schimke, DPM  OR STAFF:   Circulator: Lizabeth Leyden, RN Scrub Person: Diana Eves, CST RN First Assistant: Eliane Decree Page, RN   PREOPERATIVE DIAGNOSIS:   1.  Hallux valgus, right foot  POSTOPERATIVE DIAGNOSIS: 1.  Hallux valgus, right foot 2.  Fracture of the 1st metatarsal, right foot  PROCEDURE: 1.  Bunionectomy with osteotomy of the 1st metatarsal bone, right foot 2.  Akin osteotomy of the hallux, right foot 3.  Lengthening of the EHL tendon, right foot  ANESTHESIA:  Monitor Anesthesia Care   HEMOSTASIS:   Pneumatic ankle tourniquet set at 250 mmHg  ESTIMATED BLOOD LOSS:   Minimal (<5 cc)  MATERIALS USED:  Stryker plate and screws, Stryker staple  INJECTABLES: Marcaine 0.5% plain; 20mL  PATHOLOGY:   None  COMPLICATIONS:   Fracture of the plantar portion of the first metatarsal osteotomy.  INDICATIONS:  Painful bunion deformity of the right foot.  Her pain has failed to respond to alterations in shoegear.  DICTATION:  Note written in EPIC

## 2012-11-27 NOTE — Op Note (Signed)
OPERATIVE NOTE  DATE OF PROCEDURE:  11/27/2012  SURGEON:   Dallas Schimke, DPM  OR STAFF:   Circulator: Lizabeth Leyden, RN Scrub Person: Diana Eves, CST RN First Assistant: Eliane Decree Page, RN   PREOPERATIVE DIAGNOSIS:   1.  Hallux valgus, right foot  POSTOPERATIVE DIAGNOSIS: 1.  Hallux valgus, right foot 2.  Fracture of the 1st metatarsal, right foot  PROCEDURE: 1.  Bunionectomy with osteotomy of the 1st metatarsal bone, right foot 2.  Akin osteotomy of the hallux, right foot 3.  Lengthening of the EHL tendon, right foot  ANESTHESIA:  Monitor Anesthesia Care   HEMOSTASIS:   Pneumatic ankle tourniquet set at 250 mmHg  ESTIMATED BLOOD LOSS:   Minimal (<5 cc)  MATERIALS USED:  Stryker plate and screws, Stryker staple  INJECTABLES: Marcaine 0.5% plain; 20mL  PATHOLOGY:   None  COMPLICATIONS:   Fracture of the plantar portion of the first metatarsal osteotomy.  INDICATIONS:  Painful bunion deformity of the right foot.  Her pain has failed to respond to alterations in shoegear.  DESCRIPTION OF THE PROCEDURE:   The patient was brought to the operating room and placed on the operative table in the supine position.  A pneumatic ankle tourniquet was applied to the patient's ankle.  Following sedation, the surgical site was anesthetized with 0.5% Marcaine plain.  The foot was then prepped, scrubbed, and draped in the usual sterile technique.  The foot was elevated, exsanguinated and the pneumatic ankle tourniquet inflated to 250 mmHg.    Attention was then directed to the dorsomedial aspect of the first metatarsal phalangeal joint where a 6-cm linear incision was made medial and parallel to the course of the extensor hallucis longus tendon.  The incision was deepened through subcutaneous tissues.  Vital neurovascular structures were identified and retracted.  All bleeders were identified and cauterized.  The incision was deepened to the level of the first  metatarsal phalangeal joint capsule.  An inverted L-type capsulotomy was performed over the dorsal aspect of the first metatarsal phalangeal joint.  The capsular and periosteal structures were dissected free of their osseous attachments and reflected medially and laterally thus exposing the head of the first metatarsal at the operative site.  Utilizing a sagittal saw, the medial prominence was resected and passed from the operative field.  All rough edges were smoothed.    Attention was directed to the first interspace via the original skin incision.  Using sharp and blunt dissection, the fibular sesamoid was freed of its soft tissue attachments proximally, laterally and distally.  The conjoined tendon of the adductor hallucis muscle was identified and transected at its attachment to the base of the proximal phalanx of the hallux.    Attention was redirected to the medial aspect of the first metatarsal head where a through and through oblique osteotomy was created in the diaphyseal region of the first metatarsal bone using a sagittal bone saw.  Upon completion of the osteotomy the capital fragment was distracted and shifted laterally into a more corrected position.  A k-wire from the Stryker cannulated screw set was inserted across the osteotomy site from a dorsal to plantar direction.  A Stryker cannulated screw was inserted across the k-wire.  A second k-wire from the Stryker cannulated screw set was inserted across the osteotomy site from a dorsal to plantar direction.  A second Stryker cannulated screw was inserted across the k-wire.  A fracture of the plantar aspect of the osteotomy.  It was  determined that plate fixation would provide a more stable construct given the fracture.  The two screws were removed.  A Stryker place and screws were placed across the osteotomy.  Adequate compression was noted.  The position of the hardware was confirmed with fluoroscopy and noted to be in acceptable alignment.   The remaining medial bone shelf was resected utilizing a sagittal bone saw and passed from the operative field.    There was persistent lateral deviation of the hallux.  It was determined that an Akin osteotomy would be required to address the persistent lateral position of the hallux.  Attention was directed to the proximal phalanx.  A wedge shaped osteotomy was performed with the base oriented medially.  The wedge of bone was removed.  The lateral hinge was feathered to allow for adequate closure of the osteotomy.  The osteotomy was fixated with a Stryker Easyclip.  The position of the hardware was confirmed with fluoroscopy and noted to be in acceptable alignment.  Adequate compression was noted.  The transverse plane alignment of the hallux was found to be improved.    The extensor hallucis tendon was found to be taught producing dorsal contracture of the hallux.  A Z-lengthening of the extensor hallucis longus tendon was performed.  The tendon was reapproximated with 4-0 Vicryl.  The contracture of the hallux was found to be reduces.    The surgical sites were copiously irrigated. The periosteal and capsular tissues were approximated with 2-0 Vicryl suture.  4-0 Vicryl was used to approximate the subcutaneous tissues. 4-0 Vicryl was used to approximate the skin in a subcuticular manner.    The patient tolerated the procedures well.  The patient was then transferred to PACU with vital signs stable and vascular status intact to all toes of the operative foot.  Following a period of postoperative monitoring, the patient will be discharged home.

## 2012-11-27 NOTE — Progress Notes (Signed)
HISTORY AND PHYSICAL INTERVAL NOTE:  11/27/2012  7:24 AM  Robin Gardner  has presented today for surgery, with the diagnosis of hallux valgus right foot.  The various methods of treatment have been discussed with the patient.  No guarantees were given.  After consideration of risks, benefits and other options for treatment, the patient has consented to surgery.  I have reviewed the patients' chart and labs.    Patient Vitals for the past 24 hrs:  BP Temp Temp src Pulse Resp SpO2 Height Weight  11/27/12 0715 141/82 mmHg - - - 26 95 % - -  11/27/12 0710 136/87 mmHg - - - 11 95 % - -  11/27/12 0705 139/87 mmHg - - - 17 97 % - -  11/27/12 0700 126/77 mmHg - - - 16 97 % - -  11/27/12 0655 122/78 mmHg - - - 17 98 % - -  11/27/12 0650 136/82 mmHg - - - 17 96 % - -  11/27/12 0645 134/88 mmHg - - - 22 97 % - -  11/27/12 0640 134/88 mmHg 98.2 F (36.8 C) Oral 70 20 96 % 5\' 8"  (1.727 m) 85.276 kg (188 lb)    A history and physical examination was performed in my office.  The patient was reexamined.  There have been no changes to this history and physical examination.  Dallas Schimke, DPM

## 2012-11-27 NOTE — Anesthesia Preprocedure Evaluation (Signed)
Anesthesia Evaluation  Patient identified by MRN, date of birth, ID band Patient awake    Reviewed: Allergy & Precautions, H&P , NPO status , Patient's Chart, lab work & pertinent test results  Airway Mallampati: III TM Distance: >3 FB Neck ROM: Full    Dental  (+) Teeth Intact   Pulmonary neg pulmonary ROS,  breath sounds clear to auscultation        Cardiovascular negative cardio ROS  Rhythm:Regular Rate:Normal     Neuro/Psych    GI/Hepatic PUD, GERD-  Medicated and Controlled,  Endo/Other  Hypothyroidism   Renal/GU      Musculoskeletal   Abdominal   Peds  Hematology   Anesthesia Other Findings   Reproductive/Obstetrics                           Anesthesia Physical Anesthesia Plan  ASA: II  Anesthesia Plan: MAC   Post-op Pain Management:    Induction: Intravenous  Airway Management Planned: Nasal Cannula  Additional Equipment:   Intra-op Plan:   Post-operative Plan:   Informed Consent: I have reviewed the patients History and Physical, chart, labs and discussed the procedure including the risks, benefits and alternatives for the proposed anesthesia with the patient or authorized representative who has indicated his/her understanding and acceptance.     Plan Discussed with:   Anesthesia Plan Comments:         Anesthesia Quick Evaluation

## 2012-12-02 ENCOUNTER — Encounter (HOSPITAL_COMMUNITY): Payer: Self-pay | Admitting: Podiatry

## 2012-12-09 ENCOUNTER — Telehealth: Payer: Self-pay | Admitting: Family Medicine

## 2012-12-09 ENCOUNTER — Encounter: Payer: Self-pay | Admitting: Family Medicine

## 2012-12-09 ENCOUNTER — Ambulatory Visit (INDEPENDENT_AMBULATORY_CARE_PROVIDER_SITE_OTHER): Payer: BC Managed Care – PPO | Admitting: Family Medicine

## 2012-12-09 VITALS — BP 118/78 | Temp 98.8°F | Ht 68.0 in | Wt 188.0 lb

## 2012-12-09 DIAGNOSIS — R300 Dysuria: Secondary | ICD-10-CM

## 2012-12-09 LAB — POCT URINALYSIS DIPSTICK: Blood, UA: 250

## 2012-12-09 MED ORDER — FLUCONAZOLE 150 MG PO TABS: 150.0000 mg | ORAL_TABLET | Freq: Once | ORAL | Status: DC

## 2012-12-09 MED ORDER — CIPROFLOXACIN HCL 500 MG PO TABS: 500.0000 mg | ORAL_TABLET | Freq: Two times a day (BID) | ORAL | Status: DC

## 2012-12-09 NOTE — Telephone Encounter (Signed)
FYI, patient states that this is something we have done for her in the past.

## 2012-12-09 NOTE — Telephone Encounter (Signed)
Patient would like something called in for bladder infection. She says cipro usually helps. [employer]

## 2012-12-09 NOTE — Patient Instructions (Signed)
If fevers or severe flank pain

## 2012-12-09 NOTE — Telephone Encounter (Signed)
Patient last seen 10/15/12 for sinus infxn

## 2012-12-09 NOTE — Telephone Encounter (Signed)
Needs office visit for antibiotic per Dr. Lorin Picket- Patient scheduled office visit.

## 2012-12-09 NOTE — Progress Notes (Signed)
  Subjective:    Patient ID: Robin Gardner, female    DOB: 01/13/1966, 47 y.o.   MRN: [id number]  Urinary Tract Infection  This is a new problem. The current episode started in the past 7 days. The quality of the pain is described as burning. Associated symptoms include frequency and urgency. She has tried increased fluids for the symptoms.      Review of Systems  Genitourinary: Positive for urgency and frequency.       Objective:   Physical Exam Lungs clear heart regular flanks nontender UA with wbc's TNTC       Assessment & Plan:  UTI antibiotics as directed Cipro 7 days Diflucan if necessary warning signs discussed followup if problems

## 2012-12-10 ENCOUNTER — Encounter: Payer: Self-pay | Admitting: *Deleted

## 2012-12-10 NOTE — H&P (Signed)
HISTORY AND PHYSICAL INTERVAL NOTE:  11/27/2012  7:24 AM  Alayasia C Maulden  has presented today for surgery, with the diagnosis of hallux valgus right foot.  The various methods of treatment have been discussed with the patient.  No guarantees were given.  After consideration of risks, benefits and other options for treatment, the patient has consented to surgery.  I have reviewed the patients' chart and labs.    Patient Vitals for the past 24 hrs:  BP Temp Temp src Pulse Resp SpO2 Height Weight  11/27/12 0715 141/82 mmHg - - - 26 95 % - -  11/27/12 0710 136/87 mmHg - - - 11 95 % - -  11/27/12 0705 139/87 mmHg - - - 17 97 % - -  11/27/12 0700 126/77 mmHg - - - 16 97 % - -  11/27/12 0655 122/78 mmHg - - - 17 98 % - -  11/27/12 0650 136/82 mmHg - - - 17 96 % - -  11/27/12 0645 134/88 mmHg - - - 22 97 % - -  11/27/12 0640 134/88 mmHg 98.2 F (36.8 C) Oral 70 20 96 % 5' 8" (1.727 m) 85.276 kg (188 lb)    A history and physical examination was performed in my office.  The patient was reexamined.  There have been no changes to this history and physical examination.  Fatema Rabe Ivan Ivorie Uplinger, DPM         

## 2012-12-16 ENCOUNTER — Other Ambulatory Visit: Payer: Self-pay | Admitting: Family Medicine

## 2013-03-10 ENCOUNTER — Other Ambulatory Visit (HOSPITAL_COMMUNITY)
Admission: RE | Admit: 2013-03-10 | Discharge: 2013-03-10 | Disposition: A | Discharge status: Home / Self Care | Payer: BC Managed Care – PPO | Source: Ambulatory Visit | Attending: Gastroenterology | Admitting: Gastroenterology

## 2013-03-10 ENCOUNTER — Other Ambulatory Visit: Payer: Self-pay | Admitting: Gastroenterology

## 2013-03-10 DIAGNOSIS — K5289 Other specified noninfective gastroenteritis and colitis: Secondary | ICD-10-CM | POA: Insufficient documentation

## 2013-03-14 ENCOUNTER — Other Ambulatory Visit: Payer: Self-pay | Admitting: Family Medicine

## 2013-03-16 ENCOUNTER — Other Ambulatory Visit: Payer: Self-pay | Admitting: Podiatry

## 2013-03-16 NOTE — Telephone Encounter (Signed)
2 refill then she needs an office visit specifically for Xanax before further refills

## 2013-03-16 NOTE — Telephone Encounter (Signed)
Last office visit 12/09/12 (sick visit)

## 2013-03-17 ENCOUNTER — Other Ambulatory Visit: Payer: Self-pay | Admitting: *Deleted

## 2013-03-17 MED ORDER — ALPRAZOLAM 0.5 MG PO TABS: 0.5000 mg | ORAL_TABLET | Freq: Two times a day (BID) | ORAL | Status: DC | PRN

## 2013-03-18 NOTE — Addendum Note (Signed)
Addended by: Ilee [place] on: 03/18/2013 12:14 AM   Modules accepted: Orders  

## 2013-03-20 ENCOUNTER — Other Ambulatory Visit: Payer: Self-pay | Admitting: Family Medicine

## 2013-03-24 NOTE — Telephone Encounter (Signed)
Refill times 2 then needs OV

## 2013-03-31 NOTE — Telephone Encounter (Signed)
I cannot tell if this is ever been done, please refill x2, further refills will require office visit thank you see previous messages

## 2013-04-06 ENCOUNTER — Encounter: Payer: Self-pay | Admitting: Family Medicine

## 2013-04-06 ENCOUNTER — Ambulatory Visit (INDEPENDENT_AMBULATORY_CARE_PROVIDER_SITE_OTHER): Payer: BC Managed Care – PPO | Admitting: Family Medicine

## 2013-04-06 VITALS — BP 110/78 | Ht 68.0 in | Wt 188.0 lb

## 2013-04-06 DIAGNOSIS — M25569 Pain in unspecified knee: Secondary | ICD-10-CM

## 2013-04-06 DIAGNOSIS — M25562 Pain in left knee: Secondary | ICD-10-CM

## 2013-04-06 MED ORDER — PREDNISONE 20 MG PO TABS: ORAL_TABLET | ORAL | Status: DC

## 2013-04-06 MED ORDER — OXYCODONE-ACETAMINOPHEN 5-325 MG PO TABS: 1.0000 | ORAL_TABLET | Freq: Four times a day (QID) | ORAL | Status: DC | PRN

## 2013-04-06 NOTE — Progress Notes (Signed)
   Subjective:    Patient ID: Robin Gardner, female    DOB: Jul 26, 1965, 47 y.o.   MRN: [id number]  Knee Pain  The incident occurred 5 to 7 days ago. The symptoms are aggravated by movement and weight bearing. She has tried ice (pain med) for the symptoms.   PMH benign   Review of Systems Patient relates severe pain difficult to put weight on it difficult to watch it over the weekend had to take some Percocet denied fevers denied injury. No prior troubles.    Objective:   Physical Exam Left knee swollen ligaments stable no crepitus spell. No redness. Normal fine normal. Right leg knee normal.       Assessment & Plan:  Left knee swollen consistent with possible tendinitis/bursitis versus gout recommend uric acid level. Prednisone taper. Percocet as needed for pain cautioned drowsiness.

## 2013-04-07 NOTE — Telephone Encounter (Signed)
Help!! This message won't go away. I thought I responded to it twice earlier.

## 2013-04-08 ENCOUNTER — Encounter (HOSPITAL_COMMUNITY): Payer: Self-pay | Admitting: Pharmacy Technician

## 2013-04-10 ENCOUNTER — Telehealth: Payer: Self-pay | Admitting: Family Medicine

## 2013-04-10 NOTE — Telephone Encounter (Signed)
Pt calling to state that her knee swelling is better, but still kind of puffy above the knee cap, still with pain but not as bad. Tolerable at this point, do you think she needs to see an ortho or just give it some more time? Has seen Dr Lorelee New in [facility]? Not sure name spelt correct if you wish to refer

## 2013-04-10 NOTE — Telephone Encounter (Signed)
Notified patient recommend to give till Tues, if not well bt then call and we will do a referral. Patient verbalized understanding.

## 2013-04-10 NOTE — Telephone Encounter (Signed)
I would rec to give till Tues, if not well bt then call and we will do a referral

## 2013-04-13 ENCOUNTER — Encounter: Payer: Self-pay | Admitting: Family Medicine

## 2013-04-14 ENCOUNTER — Ambulatory Visit (HOSPITAL_COMMUNITY)
Admission: RE | Admit: 2013-04-14 | Discharge: 2013-04-14 | Disposition: A | Discharge status: Home / Self Care | Payer: BC Managed Care – PPO | Source: Ambulatory Visit | Attending: Podiatry | Admitting: Podiatry

## 2013-04-14 ENCOUNTER — Encounter (HOSPITAL_COMMUNITY): Payer: Self-pay

## 2013-04-14 ENCOUNTER — Encounter (HOSPITAL_COMMUNITY)
Admission: RE | Admit: 2013-04-14 | Discharge: 2013-04-14 | Disposition: A | Discharge status: Home / Self Care | Payer: BC Managed Care – PPO | Source: Ambulatory Visit | Attending: Podiatry | Admitting: Podiatry

## 2013-04-14 DIAGNOSIS — Z01812 Encounter for preprocedural laboratory examination: Secondary | ICD-10-CM | POA: Insufficient documentation

## 2013-04-14 DIAGNOSIS — M2010 Hallux valgus (acquired), unspecified foot: Secondary | ICD-10-CM | POA: Insufficient documentation

## 2013-04-14 DIAGNOSIS — Z01818 Encounter for other preprocedural examination: Secondary | ICD-10-CM | POA: Insufficient documentation

## 2013-04-14 DIAGNOSIS — M7730 Calcaneal spur, unspecified foot: Secondary | ICD-10-CM | POA: Insufficient documentation

## 2013-04-14 DIAGNOSIS — M19079 Primary osteoarthritis, unspecified ankle and foot: Secondary | ICD-10-CM | POA: Insufficient documentation

## 2013-04-14 HISTORY — DX: Depression, unspecified: F32.A

## 2013-04-14 HISTORY — DX: Major depressive disorder, single episode, unspecified: F32.9

## 2013-04-14 HISTORY — DX: Anxiety disorder, unspecified: F41.9

## 2013-04-14 LAB — BASIC METABOLIC PANEL
CO2: 26 mEq/L (ref 19–32)
Calcium: 9.5 mg/dL (ref 8.4–10.5)
Chloride: 101 mEq/L (ref 96–112)
GFR calc non Af Amer: 85 mL/min — ABNORMAL LOW (ref >90)
Sodium: 141 mEq/L (ref 135–145)

## 2013-04-14 LAB — HEMOGLOBIN AND HEMATOCRIT, BLOOD: HCT: 42.5 % (ref 36.0–46.0)

## 2013-04-14 NOTE — Patient Instructions (Signed)
TAMIA DIAL  04/14/2013   Your procedure is scheduled on:  04/23/2013  Report to [facility] at  615 AM.  Call this number if you have problems the morning of surgery: [phone]   Remember:   Do not eat food or drink liquids after midnight.   Take these medicines the morning of surgery with A SIP OF WATER: xanax, uceris, levothyroxine, zofran,oxycodone, protonix, zoloft   Do not wear jewelry, make-up or nail polish.  Do not wear lotions, powders, or perfumes.   Do not shave 48 hours prior to surgery. Men may shave face and neck.  Do not bring valuables to the hospital.  [facility] is not responsible  for any belongings or valuables.               Contacts, dentures or bridgework may not be worn into surgery.  Leave suitcase in the car. After surgery it may be brought to your room.  For patients admitted to the hospital, discharge time is determined by your treatment team.               Patients discharged the day of surgery will not be allowed to drive home.  Name and phone number of your driver: family  Special Instructions: Shower using CHG 2 nights before surgery and the night before surgery.  If you shower the day of surgery use CHG.  Use special wash - you have one bottle of CHG for all showers.  You should use approximately 1/3 of the bottle for each shower.   Please read over the following fact sheets that you were given: Pain Booklet, Coughing and Deep Breathing, Surgical Site Infection Prevention, Anesthesia Post-op Instructions and Care and Recovery After Surgery Bunion You have a bunion deformity of the feet. This is more common in women. It tends to be an inherited problem. Symptoms can include pain, swelling, and deformity around the great toe. Numbness and tingling may also be present. Your symptoms are often worsened by wearing shoes that cause pressure on the bunion. Changing the type of shoes you wear helps reduce symptoms. A wide shoe decreases pressure on  the bunion. An arch support may be used if you have flat feet. Avoid shoes with heels higher than two inches. This puts more pressure on the bunion. X-rays may be helpful in evaluating the severity of the problem. Other foot problems often seen with bunions include corns, calluses, and hammer toes. If the deformity or pain is severe, surgical treatment may be necessary. Keep off your painful foot as much as possible until the pain is relieved. Call your caregiver if your symptoms are worse.  SEEK IMMEDIATE MEDICAL CARE IF:  You have increased redness, pain, swelling, or other symptoms of infection. Document Released: 04/23/2005 Document Revised: 07/16/2011 Document Reviewed: 10/21/2006 [facility] Patient Information 2014 [place], [place]. PATIENT INSTRUCTIONS POST-ANESTHESIA  IMMEDIATELY FOLLOWING SURGERY:  Do not drive or operate machinery for the first twenty four hours after surgery.  Do not make any important decisions for twenty four hours after surgery or while taking narcotic pain medications or sedatives.  If you develop intractable nausea and vomiting or a severe headache please notify your doctor immediately.  FOLLOW-UP:  Please make an appointment with your surgeon as instructed. You do not need to follow up with anesthesia unless specifically instructed to do so.  WOUND CARE INSTRUCTIONS (if applicable):  Keep a dry clean dressing on the anesthesia/puncture wound site if there is drainage.  Once the wound has quit draining you may leave it open to air.  Generally you should leave the bandage intact for twenty four hours unless there is drainage.  If the epidural site drains for more than 36-48 hours please call the anesthesia department.  QUESTIONS?:  Please feel free to call your physician or the hospital operator if you have any questions, and they will be happy to assist you.

## 2013-04-16 ENCOUNTER — Ambulatory Visit (INDEPENDENT_AMBULATORY_CARE_PROVIDER_SITE_OTHER): Payer: BC Managed Care – PPO | Admitting: Nurse Practitioner

## 2013-04-16 ENCOUNTER — Encounter: Payer: Self-pay | Admitting: Family Medicine

## 2013-04-16 ENCOUNTER — Encounter: Payer: Self-pay | Admitting: Nurse Practitioner

## 2013-04-16 VITALS — BP 128/86 | Temp 98.3°F | Ht 68.0 in | Wt 188.4 lb

## 2013-04-16 DIAGNOSIS — J329 Chronic sinusitis, unspecified: Secondary | ICD-10-CM

## 2013-04-16 MED ORDER — AZITHROMYCIN 250 MG PO TABS: ORAL_TABLET | ORAL | Status: DC

## 2013-04-17 ENCOUNTER — Encounter: Payer: Self-pay | Admitting: Nurse Practitioner

## 2013-04-17 NOTE — Progress Notes (Signed)
Subjective:  Presents complaints of sinus symptoms over the past 3 days. No fever. Nonproductive cough. Facial area headache. Mild malaise today. No sore throat or ear pain. No wheezing. No vomiting diarrhea or abdominal pain. No relief with OTC meds. Is scheduled for bunionectomy next week.  Objective:   BP 128/86  Temp(Src) 98.3 F (36.8 C) (Oral)  Ht 5\' 8"  (1.727 m)  Wt 188 lb 6.4 oz (85.458 kg)  BMI 28.65 kg/m2 NAD. Alert, oriented. TMs significant retraction, no erythema. Pharynx injected with PND noted. Neck supple with mild soft nontender adenopathy. Lungs clear. Heart regular rate rhythm.  Assessment:Rhinosinusitis  Plan: Meds ordered this encounter  Medications  . azithromycin (ZITHROMAX Z-PAK) 250 MG tablet    Sig: Take 2 tablets (500 mg) on  Day 1,  followed by 1 tablet (250 mg) once daily on Days 2 through 5.    Dispense:  6 each    Refill:  0    Order Specific Question:  Supervising Provider    Answer:  Merlyn Albert [[id number]]   OTC meds as directed for congestion. Restart Flonase daily. Call back if worsens or persists.

## 2013-04-22 DIAGNOSIS — Z0289 Encounter for other administrative examinations: Secondary | ICD-10-CM

## 2013-04-23 ENCOUNTER — Encounter (HOSPITAL_COMMUNITY)
Admission: RE | Disposition: A | Discharge status: Home / Self Care | Payer: Self-pay | Source: Ambulatory Visit | Attending: Podiatry

## 2013-04-23 ENCOUNTER — Ambulatory Visit (HOSPITAL_COMMUNITY): Payer: BC Managed Care – PPO | Admitting: Anesthesiology

## 2013-04-23 ENCOUNTER — Encounter (HOSPITAL_COMMUNITY): Payer: BC Managed Care – PPO | Admitting: Anesthesiology

## 2013-04-23 ENCOUNTER — Ambulatory Visit (HOSPITAL_COMMUNITY): Payer: BC Managed Care – PPO

## 2013-04-23 ENCOUNTER — Ambulatory Visit (HOSPITAL_COMMUNITY)
Admission: RE | Admit: 2013-04-23 | Discharge: 2013-04-23 | Disposition: A | Discharge status: Home / Self Care | Payer: BC Managed Care – PPO | Source: Ambulatory Visit | Attending: Podiatry | Admitting: Podiatry

## 2013-04-23 ENCOUNTER — Encounter (HOSPITAL_COMMUNITY): Payer: Self-pay | Admitting: *Deleted

## 2013-04-23 DIAGNOSIS — M2010 Hallux valgus (acquired), unspecified foot: Secondary | ICD-10-CM | POA: Insufficient documentation

## 2013-04-23 DIAGNOSIS — M2012 Hallux valgus (acquired), left foot: Secondary | ICD-10-CM

## 2013-04-23 HISTORY — PX: BUNIONECTOMY: SHX129

## 2013-04-23 HISTORY — PX: AIKEN OSTEOTOMY: SHX6331

## 2013-04-23 SURGERY — BUNIONECTOMY
Anesthesia: Monitor Anesthesia Care | Site: Foot | Laterality: Left

## 2013-04-23 MED ORDER — ONDANSETRON HCL 4 MG/2ML IJ SOLN: 4.0000 mg | Freq: Once | INTRAMUSCULAR | Status: DC | PRN

## 2013-04-23 MED ORDER — FENTANYL CITRATE 0.05 MG/ML IJ SOLN
INTRAMUSCULAR | Status: AC
  Filled 2013-04-23: qty 2

## 2013-04-23 MED ORDER — LACTATED RINGERS IV SOLN
INTRAVENOUS | Status: DC
  Administered 2013-04-23: 1000 mL via INTRAVENOUS

## 2013-04-23 MED ORDER — PROPOFOL INFUSION 10 MG/ML OPTIME
INTRAVENOUS | Status: DC | PRN
  Administered 2013-04-23: 09:00:00 via INTRAVENOUS
  Administered 2013-04-23: 50 ug/kg/min via INTRAVENOUS

## 2013-04-23 MED ORDER — MIDAZOLAM HCL 2 MG/2ML IJ SOLN
INTRAMUSCULAR | Status: AC
  Filled 2013-04-23: qty 2

## 2013-04-23 MED ORDER — FENTANYL CITRATE 0.05 MG/ML IJ SOLN
INTRAMUSCULAR | Status: DC | PRN
  Administered 2013-04-23 (×2): 50 ug via INTRAVENOUS

## 2013-04-23 MED ORDER — PROPOFOL 10 MG/ML IV BOLUS
INTRAVENOUS | Status: AC
  Filled 2013-04-23: qty 20

## 2013-04-23 MED ORDER — CLINDAMYCIN PHOSPHATE 600 MG/50ML IV SOLN
600.0000 mg | Freq: Once | INTRAVENOUS | Status: AC
  Administered 2013-04-23: 600 mg via INTRAVENOUS
  Filled 2013-04-23: qty 50

## 2013-04-23 MED ORDER — PROPOFOL 10 MG/ML IV EMUL
INTRAVENOUS | Status: AC
  Filled 2013-04-23: qty 20

## 2013-04-23 MED ORDER — BUPIVACAINE HCL (PF) 0.5 % IJ SOLN
INTRAMUSCULAR | Status: AC
  Filled 2013-04-23: qty 60

## 2013-04-23 MED ORDER — MIDAZOLAM HCL 2 MG/2ML IJ SOLN
1.0000 mg | INTRAMUSCULAR | Status: DC | PRN
  Administered 2013-04-23: 2 mg via INTRAVENOUS

## 2013-04-23 MED ORDER — BUPIVACAINE HCL (PF) 0.5 % IJ SOLN
INTRAMUSCULAR | Status: DC | PRN
  Administered 2013-04-23: 20 mL

## 2013-04-23 MED ORDER — FENTANYL CITRATE 0.05 MG/ML IJ SOLN: 25.0000 ug | INTRAMUSCULAR | Status: DC | PRN

## 2013-04-23 MED ORDER — BUPIVACAINE-EPINEPHRINE PF 0.5-1:200000 % IJ SOLN
INTRAMUSCULAR | Status: AC
  Filled 2013-04-23: qty 10

## 2013-04-23 MED ORDER — LIDOCAINE HCL (PF) 1 % IJ SOLN
INTRAMUSCULAR | Status: AC
  Filled 2013-04-23: qty 30

## 2013-04-23 MED ORDER — FENTANYL CITRATE 0.05 MG/ML IJ SOLN
25.0000 ug | INTRAMUSCULAR | Status: AC
  Administered 2013-04-23 (×2): 25 ug via INTRAVENOUS

## 2013-04-23 MED ORDER — MIDAZOLAM HCL 5 MG/5ML IJ SOLN
INTRAMUSCULAR | Status: DC | PRN
  Administered 2013-04-23: 2 mg via INTRAVENOUS

## 2013-04-23 MED ORDER — SODIUM CHLORIDE 0.9 % IR SOLN
Status: DC | PRN
  Administered 2013-04-23: 1000 mL

## 2013-04-23 SURGICAL SUPPLY — 58 items
APL SKNCLS STERI-STRIP NONHPOA (GAUZE/BANDAGES/DRESSINGS) ×1
BAG HAMPER (MISCELLANEOUS) ×2 IMPLANT
BANDAGE CONFORM 2  STR LF (GAUZE/BANDAGES/DRESSINGS) ×2 IMPLANT
BANDAGE ELASTIC 4 VELCRO NS (GAUZE/BANDAGES/DRESSINGS) ×2 IMPLANT
BANDAGE ESMARK 4X12 BL STRL LF (DISPOSABLE) ×1 IMPLANT
BANDAGE GAUZE ELAST BULKY 4 IN (GAUZE/BANDAGES/DRESSINGS) ×2 IMPLANT
BENZOIN TINCTURE PRP APPL 2/3 (GAUZE/BANDAGES/DRESSINGS) ×2 IMPLANT
BIT DRILL MICR ACTRK 2 LNG PRF (BIT) IMPLANT
BLADE AVERAGE 25X9 (BLADE) ×2 IMPLANT
BLADE SURG 15 STRL LF DISP TIS (BLADE) ×2 IMPLANT
BLADE SURG 15 STRL SS (BLADE) ×4
BNDG CMPR 12X4 ELC STRL LF (DISPOSABLE) ×1
BNDG ESMARK 4X12 BLUE STRL LF (DISPOSABLE) ×2
CHLORAPREP W/TINT 26ML (MISCELLANEOUS) ×2 IMPLANT
CLOTH BEACON ORANGE TIMEOUT ST (SAFETY) ×2 IMPLANT
COVER LIGHT HANDLE STERIS (MISCELLANEOUS) ×4 IMPLANT
CUFF TOURNIQUET SINGLE 18IN (TOURNIQUET CUFF) ×1 IMPLANT
DECANTER SPIKE VIAL GLASS SM (MISCELLANEOUS) ×3 IMPLANT
DRAPE OEC MINIVIEW 54X84 (DRAPES) ×2 IMPLANT
DRILL MICRO ACUTRAK 2 LNG PROF (BIT) ×2
DRSG ADAPTIC 3X8 NADH LF (GAUZE/BANDAGES/DRESSINGS) ×2 IMPLANT
DURA STEPPER LG (CAST SUPPLIES) IMPLANT
DURA STEPPER MED (CAST SUPPLIES) ×1 IMPLANT
DURA STEPPER SML (CAST SUPPLIES) IMPLANT
DURA STEPPER XL (SOFTGOODS) IMPLANT
ELECT REM PT RETURN 9FT ADLT (ELECTROSURGICAL) ×2
ELECTRODE REM PT RTRN 9FT ADLT (ELECTROSURGICAL) ×1 IMPLANT
GLOVE BIO SURGEON STRL SZ7.5 (GLOVE) ×2 IMPLANT
GLOVE BIOGEL PI IND STRL 6.5 (GLOVE) IMPLANT
GLOVE BIOGEL PI IND STRL 7.0 (GLOVE) IMPLANT
GLOVE BIOGEL PI INDICATOR 6.5 (GLOVE) ×1
GLOVE BIOGEL PI INDICATOR 7.0 (GLOVE) ×2
GLOVE ECLIPSE 6.5 STRL STRAW (GLOVE) IMPLANT
GLOVE SS N UNI LF 6.5 STRL (GLOVE) ×1 IMPLANT
GLOVE SS N UNI LF 7.5 STRL (GLOVE) ×1 IMPLANT
GOWN STRL REIN XL XLG (GOWN DISPOSABLE) ×6 IMPLANT
GUIDEWIRE ORTHO MICROSHT  ACUT (WIRE) ×1
GUIDEWIRE ORTHO MICROSHT .035 (WIRE) IMPLANT
KIT ROOM TURNOVER AP CYSTO (KITS) ×2 IMPLANT
MANIFOLD NEPTUNE II (INSTRUMENTS) ×2 IMPLANT
NDL HYPO 27GX1-1/4 (NEEDLE) ×3 IMPLANT
NEEDLE HYPO 27GX1-1/4 (NEEDLE) ×4 IMPLANT
NS IRRIG 1000ML POUR BTL (IV SOLUTION) ×2 IMPLANT
PACK BASIC LIMB (CUSTOM PROCEDURE TRAY) ×2 IMPLANT
PAD ARMBOARD 7.5X6 YLW CONV (MISCELLANEOUS) ×2 IMPLANT
RASP SM TEAR CROSS CUT (RASP) IMPLANT
SCREW ACUTRAK 2 MICRO 20MM (Screw) ×1 IMPLANT
SET BASIN LINEN APH (SET/KITS/TRAYS/PACK) ×2 IMPLANT
SPONGE GAUZE 4X4 12PLY (GAUZE/BANDAGES/DRESSINGS) ×2 IMPLANT
SPONGE LAP 18X18 X RAY DECT (DISPOSABLE) ×2 IMPLANT
STAPLE FIXATION 8X8X8 (Staple) ×1 IMPLANT
STRIP CLOSURE SKIN 1/2X4 (GAUZE/BANDAGES/DRESSINGS) ×4 IMPLANT
STRIP CLOSURE SKIN 1/4X3 (GAUZE/BANDAGES/DRESSINGS) ×2 IMPLANT
SUT PROLENE 4 0 PS 2 18 (SUTURE) ×2 IMPLANT
SUT VIC AB 2-0 CT2 27 (SUTURE) ×2 IMPLANT
SUT VIC AB 4-0 PS2 27 (SUTURE) ×2 IMPLANT
SUT VICRYL AB 3-0 FS1 BRD 27IN (SUTURE) ×1 IMPLANT
SYR CONTROL 10ML LL (SYRINGE) ×4 IMPLANT

## 2013-04-23 NOTE — Op Note (Signed)
OPERATIVE NOTE  DATE OF PROCEDURE:  04/23/2013  SURGEON:   Dallas Schimke, DPM  OR STAFF:   Circulator: Rogene Houston, RN Scrub Person: Jari Sportsman, CST RN First Assistant: Lizabeth Leyden, RN   PREOPERATIVE DIAGNOSIS:   1.  Hallux valgus, left foot. 2.  Hallux  Interphalangeus, left foot  POSTOPERATIVE DIAGNOSIS: Same  PROCEDURE: 1.  Austin bunionectomy, left foot. 2.  Akin osteotomy of the proximal phalanx of the hallux, left foot.  ANESTHESIA:  Monitor Anesthesia Care   HEMOSTASIS:   Pneumatic ankle tourniquet set at 250 mmHg  ESTIMATED BLOOD LOSS:   Minimal (<5 cc)  MATERIALS USED:  1.  AccuMed screw 2.  Stryker staple  INJECTABLES: Marcaine 0.5% plain; 20mL  PATHOLOGY:   None  COMPLICATIONS:   None  INDICATIONS:  Painful bunion deformity of the left foot that has failed to respond to changes in shoes.  DESCRIPTION OF THE PROCEDURE:   The patient was brought to the operating room and placed on the operative table in the supine position.  A pneumatic ankle tourniquet was applied to the patient's ankle.  Following sedation, the surgical site was anesthetized with 0.5% Marcaine plain.  The foot was then prepped, scrubbed, and draped in the usual sterile technique.  The foot was elevated, exsanguinated and the pneumatic ankle tourniquet inflated to 250 mmHg.    Attention was then directed to the dorsomedial aspect of the first metatarsal phalangeal joint where a linear incision was made medial and parallel to the course of the extensor hallucis longus tendon.  The incision was deepened through subcutaneous tissues.  Vital neurovascular structures were identified and retracted.  All bleeders were identified and cauterized.  The incision was deepened to the level of the first metatarsal phalangeal joint capsule.  An T-type capsulotomy was performed over the dorsal aspect of the first metatarsal phalangeal joint.  The capsular and periosteal  structures were dissected free of their osseous attachments and reflected medially and laterally thus exposing the head of the first metatarsal and proximal phalanx at the operative site.  Utilizing a sagittal saw, the medial prominence was resected and passed from the operative field.  All rough edges were smoothed.    Attention was directed to the first interspace via the original skin incision.  Using sharp and blunt dissection, the fibular sesamoid was freed of its soft tissue attachments proximally, laterally and distally.  The conjoined tendon of the adductor hallucis muscle was identified and transected at its attachment to the base of the proximal phalanx of the hallux.  The lateral contracture present on the hallux was noted to be reduced and the sesamoid apparatus was noted to float into a more corrected medial position.    Attention was redirected to the medial aspect of the first metatarsal head where a through and through chevron osteotomy was created in the metaphyseal region of the first metatarsal bone using a sagittal bone saw.  The apex of the osteotomy pointed distally.  Upon completion of the osteotomy the capital fragment was distracted and shifted laterally into a more corrected position.  A k-wire from the AccuMed cannulated screw set was inserted across the osteotomy site from a dorsal proximal to plantar distal direction.  An AccuMed cannulated screw was inserted across the k-wire.  Adequate compression was noted.  The position of the screw was confirmed with fluoroscopy and noted to be in acceptable alignment.  The remaining medial bone shelf was resected utilizing a sagittal bone saw  and passed from the operative field.    Attention was directed to the dorsal aspect of the proximal phalanx.  A wedge-shaped osteotomy was performed in the proximal one third of the proximal phalanx.  The apex was oriented laterally.  The wedge of bone was removed and passed from the operative field.   The lateral cortex was feathered to allow for adequate closure of the osteotomy.  A Stryker staple was inserted across the osteotomy site.  Position of the staple was confirmed with C-arm fluoroscopy.  The area was copiously irrigated. The periosteal and capsular tissues were approximated with 2-0 Vicryl suture.  4-0 Vicryl was used to approximate the subcutaneous tissues. 4-0 Vicryl was used to approximate the skin in a subcuticular manner.  Skin closure was reinforced with 4-0 Prolene.  The incision was reinforced with Steri-Strips.  A sterile compressive dressing was applied to the operative foot.  The patient's ankle tourniquet was deflated.  A prompt hyperemic response was noted to all digits of the operative foot.    The patient tolerated the procedure well.  The patient was then transferred to PACU with vital signs stable and vascular status intact to all toes of the operative foot.  Following a period of postoperative monitoring, the patient will be discharged home.

## 2013-04-23 NOTE — Transfer of Care (Signed)
Immediate Anesthesia Transfer of Care Note  Patient: Robin Gardner  Procedure(s) Performed: Procedure(s): AUSTIN BUNIONECTOMY (Left) Quintella Reichert OSTEOTOMY (Left)  Patient Location: PACU  Anesthesia Type:MAC  Level of Consciousness: awake, alert , oriented and patient cooperative  Airway & Oxygen Therapy: Patient Spontanous Breathing  Post-op Assessment: Report given to PACU RN, Post -op Vital signs reviewed and stable and Patient moving all extremities  Post vital signs: Reviewed and stable  Complications: No apparent anesthesia complications

## 2013-04-23 NOTE — Anesthesia Postprocedure Evaluation (Signed)
  Anesthesia Post-op Note  Patient: Robin Gardner  Procedure(s) Performed: Procedure(s): AUSTIN BUNIONECTOMY (Left) Quintella Reichert OSTEOTOMY (Left)  Patient Location: PACU  Anesthesia Type:MAC  Level of Consciousness: awake, alert , oriented and patient cooperative  Airway and Oxygen Therapy: Patient Spontanous Breathing  Post-op Pain: 2 /10, mild  Post-op Assessment: Post-op Vital signs reviewed, Patient's Cardiovascular Status Stable, Respiratory Function Stable, Patent Airway and Pain level controlled  Post-op Vital Signs: Reviewed and stable  Complications: No apparent anesthesia complications

## 2013-04-23 NOTE — Anesthesia Preprocedure Evaluation (Signed)
Anesthesia Evaluation  Patient identified by MRN, date of birth, ID band Patient awake    Reviewed: Allergy & Precautions, H&P , NPO status , Patient's Chart, lab work & pertinent test results  Airway Mallampati: III TM Distance: >3 FB Neck ROM: Full    Dental  (+) Teeth Intact   Pulmonary neg pulmonary ROS,  breath sounds clear to auscultation        Cardiovascular negative cardio ROS  Rhythm:Regular Rate:Normal     Neuro/Psych    GI/Hepatic PUD, GERD-  Medicated and Controlled,  Endo/Other  Hypothyroidism   Renal/GU      Musculoskeletal   Abdominal   Peds  Hematology   Anesthesia Other Findings   Reproductive/Obstetrics                           Anesthesia Physical Anesthesia Plan  ASA: II  Anesthesia Plan: MAC   Post-op Pain Management:    Induction: Intravenous  Airway Management Planned: Nasal Cannula  Additional Equipment:   Intra-op Plan:   Post-operative Plan:   Informed Consent: I have reviewed the patients History and Physical, chart, labs and discussed the procedure including the risks, benefits and alternatives for the proposed anesthesia with the patient or authorized representative who has indicated his/her understanding and acceptance.     Plan Discussed with:   Anesthesia Plan Comments:         Anesthesia Quick Evaluation

## 2013-04-23 NOTE — H&P (Signed)
HISTORY AND PHYSICAL INTERVAL NOTE:  04/23/2013  7:17 AM  [name]  has presented today for surgery, with the diagnosis of hallux valgus, hallux  interphalangeus left foot.  The various methods of treatment have been discussed with the patient.  No guarantees were given.  After consideration of risks, benefits and other options for treatment, the patient has consented to surgery.  I have reviewed the patients' chart and labs.    Patient Vitals for the past 24 hrs:  BP Temp Temp src Pulse Resp SpO2  04/23/13 0637 155/89 mmHg 98.2 F (36.8 C) Oral 68 - 98 %  04/23/13 0635 155/89 mmHg - - - 34 97 %  04/23/13 0630 - - - - - 97 %    A history and physical examination was performed in my office.  The patient was reexamined.  She was diagnosed with a sinus issues and placed on a Z-pack.  She has completed the Z-pack.  She is feeling better.  There have been no other changes to this history and physical examination.  Dallas Schimke, DPM

## 2013-04-27 ENCOUNTER — Encounter (HOSPITAL_COMMUNITY): Payer: Self-pay | Admitting: Podiatry

## 2013-05-19 ENCOUNTER — Telehealth: Payer: Self-pay | Admitting: Family Medicine

## 2013-05-19 NOTE — Telephone Encounter (Signed)
rec ov this wk

## 2013-05-19 NOTE — Telephone Encounter (Signed)
Patient says she woke up with a really bad headache today. She went to [facility] to get medications and while she was there she checked her blood pressure which read 156/100. She wants to know if this is normal and what she can do for this?  156/100

## 2013-05-19 NOTE — Telephone Encounter (Signed)
According to med list, patient is not currently taking any blood pressure medication.

## 2013-05-19 NOTE — Telephone Encounter (Signed)
Transferred to front desk to schedule appointment for this week.

## 2013-05-20 ENCOUNTER — Encounter: Payer: Self-pay | Admitting: Family Medicine

## 2013-05-20 ENCOUNTER — Ambulatory Visit (INDEPENDENT_AMBULATORY_CARE_PROVIDER_SITE_OTHER): Payer: BC Managed Care – PPO | Admitting: Family Medicine

## 2013-05-20 VITALS — BP 130/84 | Ht 68.0 in | Wt 188.0 lb

## 2013-05-20 DIAGNOSIS — I10 Essential (primary) hypertension: Secondary | ICD-10-CM

## 2013-05-20 HISTORY — DX: Essential (primary) hypertension: I10

## 2013-05-20 MED ORDER — ENALAPRIL MALEATE 5 MG PO TABS: 5.0000 mg | ORAL_TABLET | Freq: Every day | ORAL | Status: DC

## 2013-05-20 NOTE — Progress Notes (Signed)
   Subjective:    Patient ID: Robin Gardner, female    DOB: 1965-06-02, 48 y.o.   MRN: [id number]  HPI Patient arrives to check on blood pressure. Patient states that she woke up yest with a bad headache and went to store for Tylenol and checked her BP at the store and it was 156/100 and this concerns her.  Was on blood pressure medication in the past. While on steroids.  Strong fam hx of high b p, plus personal elev while on steroids  Some headache with elevated blood pressure. Patient trying to watch salt intake. Not exercising much due to flare of colitis  Review of Systems Positive for headache no chest pain no back pain no shortness of breath ROS otherwise negative    Objective:   Physical Exam Alert hydration good. H&T normal BP 144/92. Lungs clear. Heart regular in rhythm.       Assessment & Plan:  Impression hypertension discussed time to resume medicine with symptoms plan ACE inhibitor prescribed. Side effects benefits discussed recheck in several months. Diet exercise discussed. WSL

## 2013-05-27 ENCOUNTER — Ambulatory Visit (HOSPITAL_COMMUNITY)
Admission: RE | Admit: 2013-05-27 | Discharge: 2013-05-27 | Disposition: A | Discharge status: Home / Self Care | Payer: BC Managed Care – PPO | Source: Ambulatory Visit | Attending: Podiatry | Admitting: Podiatry

## 2013-05-27 ENCOUNTER — Other Ambulatory Visit (HOSPITAL_COMMUNITY): Payer: Self-pay | Admitting: Podiatry

## 2013-05-27 DIAGNOSIS — M79609 Pain in unspecified limb: Secondary | ICD-10-CM | POA: Insufficient documentation

## 2013-05-27 DIAGNOSIS — R52 Pain, unspecified: Secondary | ICD-10-CM

## 2013-05-27 DIAGNOSIS — R609 Edema, unspecified: Secondary | ICD-10-CM | POA: Insufficient documentation

## 2013-06-27 ENCOUNTER — Other Ambulatory Visit: Payer: Self-pay | Admitting: Family Medicine

## 2013-06-29 ENCOUNTER — Encounter: Payer: Self-pay | Admitting: Family Medicine

## 2013-06-29 ENCOUNTER — Ambulatory Visit (INDEPENDENT_AMBULATORY_CARE_PROVIDER_SITE_OTHER): Payer: BC Managed Care – PPO | Admitting: Family Medicine

## 2013-06-29 VITALS — BP 118/76 | Ht 68.0 in | Wt 189.0 lb

## 2013-06-29 DIAGNOSIS — IMO0001 Reserved for inherently not codable concepts without codable children: Secondary | ICD-10-CM

## 2013-06-29 MED ORDER — HYDROCODONE-ACETAMINOPHEN 5-325 MG PO TABS: ORAL_TABLET | ORAL | Status: DC

## 2013-06-29 NOTE — Progress Notes (Signed)
   Subjective:    Patient ID: Robin Gardner, female    DOB: [birth year], 48 y.o.   MRN: [id number]  Arm Pain  The pain is present in the left forearm, right elbow, left elbow, right hand, right wrist, left wrist, left hand and right forearm. The quality of the pain is described as cramping. Associated symptoms include tingling. Associated symptoms comments: swelling. She has tried acetaminophen (icy hot) for the symptoms. The treatment provided no relief.    Right arm bad ache.  Sunday morning Both arms very painful  Bad pain and sore to the touch  Fingers feel tingly and numb  triede icy hot didn't help  Out on med leave with foot  No phys activities  Not exercising No arthritis difficulties within family prematurely   Review of Systems  Neurological: Positive for tingling.   No pain elsewhere no chest pain no cough no fever ROS otherwise negative    Objective:   Physical Exam  Alert no acute distress. Lungs clear. Heart regular rate and rhythm. Arms good range of motion. Pulses strong. Sensation intact. Forearm tenderness evident bilateral right greater than left. Pain most prominent in the mid forearm. No joint tenderness.      Assessment & Plan:  Impression puzzling bilateral arm pain which the patient describes as severe. Outside of the joints. Therefore likely not related to ulcerative colitis. Discussed. Plan patient cannot take anti-inflammatory medicine with ulcerative colitis. Trial of pain medicine. Use discussed. Drowsy precautions. Hydrocodone when necessary. WSL

## 2013-07-08 ENCOUNTER — Other Ambulatory Visit: Payer: Self-pay | Admitting: Gastroenterology

## 2013-07-30 ENCOUNTER — Other Ambulatory Visit (HOSPITAL_COMMUNITY): Payer: Self-pay | Admitting: *Deleted

## 2013-07-31 ENCOUNTER — Encounter (HOSPITAL_COMMUNITY)
Admission: RE | Admit: 2013-07-31 | Discharge: 2013-07-31 | Disposition: A | Discharge status: Home / Self Care | Payer: BC Managed Care – PPO | Source: Ambulatory Visit | Attending: Gastroenterology | Admitting: Gastroenterology

## 2013-07-31 DIAGNOSIS — K5190 Ulcerative colitis, unspecified, without complications: Secondary | ICD-10-CM | POA: Insufficient documentation

## 2013-07-31 MED ORDER — DIPHENHYDRAMINE HCL 25 MG PO TABS
25.0000 mg | ORAL_TABLET | Freq: Four times a day (QID) | ORAL | Status: DC | PRN
  Filled 2013-07-31: qty 1

## 2013-07-31 MED ORDER — ACETAMINOPHEN 325 MG PO TABS
ORAL_TABLET | ORAL | Status: AC
  Administered 2013-07-31: 650 mg
  Filled 2013-07-31: qty 2

## 2013-07-31 MED ORDER — DIPHENHYDRAMINE HCL 25 MG PO CAPS
ORAL_CAPSULE | ORAL | Status: AC
  Administered 2013-07-31: 10:00:00
  Filled 2013-07-31: qty 1

## 2013-07-31 MED ORDER — SODIUM CHLORIDE 0.9 % IV SOLN
INTRAVENOUS | Status: DC
  Administered 2013-07-31: 250 mL via INTRAVENOUS

## 2013-07-31 MED ORDER — SODIUM CHLORIDE 0.9 % IV SOLN
5.0000 mg/kg | Freq: Once | INTRAVENOUS | Status: DC
  Administered 2013-07-31: 400 mg via INTRAVENOUS
  Filled 2013-07-31: qty 40

## 2013-07-31 MED ORDER — ACETAMINOPHEN 325 MG PO TABS: 650.0000 mg | ORAL_TABLET | Freq: Four times a day (QID) | ORAL | Status: DC | PRN

## 2013-08-14 ENCOUNTER — Other Ambulatory Visit (HOSPITAL_COMMUNITY): Payer: Self-pay | Admitting: *Deleted

## 2013-08-14 ENCOUNTER — Encounter (HOSPITAL_COMMUNITY): Payer: BC Managed Care – PPO

## 2013-08-17 ENCOUNTER — Encounter (HOSPITAL_COMMUNITY)
Admission: RE | Admit: 2013-08-17 | Discharge: 2013-08-17 | Disposition: A | Discharge status: Home / Self Care | Payer: BC Managed Care – PPO | Source: Ambulatory Visit | Attending: Gastroenterology | Admitting: Gastroenterology

## 2013-08-17 DIAGNOSIS — K5190 Ulcerative colitis, unspecified, without complications: Secondary | ICD-10-CM | POA: Insufficient documentation

## 2013-08-17 MED ORDER — ACETAMINOPHEN 325 MG PO TABS
ORAL_TABLET | ORAL | Status: AC
  Filled 2013-08-17: qty 2

## 2013-08-17 MED ORDER — DIPHENHYDRAMINE HCL 25 MG PO CAPS: 25.0000 mg | ORAL_CAPSULE | Freq: Four times a day (QID) | ORAL | Status: DC | PRN

## 2013-08-17 MED ORDER — SODIUM CHLORIDE 0.9 % IV SOLN
INTRAVENOUS | Status: DC
  Administered 2013-08-17: 11:00:00 via INTRAVENOUS

## 2013-08-17 MED ORDER — ACETAMINOPHEN 325 MG PO TABS
650.0000 mg | ORAL_TABLET | Freq: Four times a day (QID) | ORAL | Status: DC | PRN
  Administered 2013-08-17: 650 mg via ORAL

## 2013-08-17 MED ORDER — SODIUM CHLORIDE 0.9 % IV SOLN
5.0000 mg/kg | Freq: Once | INTRAVENOUS | Status: DC
  Administered 2013-08-17: 400 mg via INTRAVENOUS
  Filled 2013-08-17: qty 40

## 2013-09-14 ENCOUNTER — Encounter (HOSPITAL_COMMUNITY)
Admission: RE | Admit: 2013-09-14 | Discharge: 2013-09-14 | Disposition: A | Discharge status: Home / Self Care | Payer: BC Managed Care – PPO | Source: Ambulatory Visit | Attending: Gastroenterology | Admitting: Gastroenterology

## 2013-09-14 DIAGNOSIS — K5190 Ulcerative colitis, unspecified, without complications: Secondary | ICD-10-CM | POA: Insufficient documentation

## 2013-09-14 MED ORDER — INFLIXIMAB 100 MG IV SOLR
5.0000 mg/kg | Freq: Once | INTRAVENOUS | Status: AC
  Administered 2013-09-14: 400 mg via INTRAVENOUS
  Filled 2013-09-14: qty 40

## 2013-09-14 MED ORDER — ACETAMINOPHEN 325 MG PO TABS
ORAL_TABLET | ORAL | Status: AC
  Administered 2013-09-14: 650 mg via ORAL
  Filled 2013-09-14: qty 2

## 2013-09-14 MED ORDER — DIPHENHYDRAMINE HCL 25 MG PO CAPS: 25.0000 mg | ORAL_CAPSULE | Freq: Four times a day (QID) | ORAL | Status: DC | PRN

## 2013-09-14 MED ORDER — ACETAMINOPHEN 325 MG PO TABS
650.0000 mg | ORAL_TABLET | Freq: Four times a day (QID) | ORAL | Status: DC | PRN
  Administered 2013-09-14: 650 mg via ORAL

## 2013-09-14 MED ORDER — SODIUM CHLORIDE 0.9 % IV SOLN
INTRAVENOUS | Status: DC
  Administered 2013-09-14: 250 mL via INTRAVENOUS

## 2013-09-15 ENCOUNTER — Ambulatory Visit: Payer: BC Managed Care – PPO | Admitting: Family Medicine

## 2013-09-22 ENCOUNTER — Ambulatory Visit (INDEPENDENT_AMBULATORY_CARE_PROVIDER_SITE_OTHER): Payer: BC Managed Care – PPO | Admitting: Family Medicine

## 2013-09-22 ENCOUNTER — Encounter: Payer: Self-pay | Admitting: Family Medicine

## 2013-09-22 VITALS — BP 130/90 | Ht 68.0 in | Wt 186.0 lb

## 2013-09-22 DIAGNOSIS — E039 Hypothyroidism, unspecified: Secondary | ICD-10-CM

## 2013-09-22 DIAGNOSIS — M130 Polyarthritis, unspecified: Secondary | ICD-10-CM

## 2013-09-22 DIAGNOSIS — M2550 Pain in unspecified joint: Secondary | ICD-10-CM

## 2013-09-22 DIAGNOSIS — Z79899 Other long term (current) drug therapy: Secondary | ICD-10-CM

## 2013-09-22 DIAGNOSIS — K5190 Ulcerative colitis, unspecified, without complications: Secondary | ICD-10-CM

## 2013-09-22 DIAGNOSIS — F329 Major depressive disorder, single episode, unspecified: Secondary | ICD-10-CM

## 2013-09-22 DIAGNOSIS — F3289 Other specified depressive episodes: Secondary | ICD-10-CM

## 2013-09-22 DIAGNOSIS — K219 Gastro-esophageal reflux disease without esophagitis: Secondary | ICD-10-CM

## 2013-09-22 DIAGNOSIS — F32A Depression, unspecified: Secondary | ICD-10-CM

## 2013-09-22 DIAGNOSIS — Z13220 Encounter for screening for lipoid disorders: Secondary | ICD-10-CM

## 2013-09-22 DIAGNOSIS — I10 Essential (primary) hypertension: Secondary | ICD-10-CM

## 2013-09-22 NOTE — Progress Notes (Signed)
   Subjective:    Patient ID: Robin Gardner, female    DOB: 1966-03-15, 48 y.o.   MRN: [id number]  HPI Patient states she as joint/muscle pain throughout her body. She has had this pain on and off for years.  She just started Remicade infusions for her ulcerative colitis. The pain has gotten worst with the 2nd infusion.   She said that her hands hurt, both arms, and that her ankles will swell.  DR Buccini q three months  remicade infusion may 11,  Infusions have helped the ulc colitis  BMs have cut down, a lot of muscle and joint pain  Very considerable stress. Ankles and joints hurt a lot  Out of work since [employer] for disability  So so Compliant with bp meds and  Compliant with thyr med  Letter to be dict for  Not exercising  Numbness tingling sens /no sig weak ness/ upper body strength not the best   Reflux overall stable on medications. Tries not to Miss her medicine. No obvious side effects.  Compliant with blood pressure medicine. Trying to watch her salt intake. Not exercising much these days.    Review of Systems No Chest pain no dyspnea positive fatigue positive severe pain in multiple joints as noted. Intermittent loose bowels and abdominal cramps. ROS otherwise negative    Objective:   Physical Exam Alert in some distress with motion. Moderate malaise. HEENT in T. normal. Substantial warmth inflammation noted in multiple joints including wrists bilateral left ankle right knee warmth substantial pain with motion. Abdominal exam no major tenderness currently       Assessment & Plan:  Impression 1 hypertension good control. #2 hypothyroidism status uncertain. #3 depression and stress ongoing primarily secondary to substantial medical problems.. #4 severe arthritis secondary to ulcerative colitis. Definitely disabling for this patient. #5 ulcerative colitis with unfortunately ongoing substantial side effects of fatigue with medication. Plan  appropriate blood work. Diet exercise discussed. Maintain same medications. Recheck in 6 months. Patient requested that we write a letter supporting her disability which is extremely appropriate considering her situation.

## 2013-09-23 LAB — LIPID PANEL
CHOL/HDL RATIO: 5.9 ratio
CHOLESTEROL: 200 mg/dL (ref 0–200)
HDL: 34 mg/dL — AB (ref >39)
LDL Cholesterol: 127 mg/dL — ABNORMAL HIGH (ref 0–99)
Triglycerides: 195 mg/dL — ABNORMAL HIGH (ref <150)
VLDL: 39 mg/dL (ref 0–40)

## 2013-09-23 LAB — CBC
HCT: 41.8 % (ref 36.0–46.0)
Hemoglobin: 14.1 g/dL (ref 12.0–15.0)
MCH: 29.3 pg (ref 26.0–34.0)
MCHC: 33.7 g/dL (ref 30.0–36.0)
MCV: 86.7 fL (ref 78.0–100.0)
PLATELETS: 343 10*3/uL (ref 150–400)
RBC: 4.82 MIL/uL (ref 3.87–5.11)
RDW: 13.9 % (ref 11.5–15.5)
WBC: 7.1 10*3/uL (ref 4.0–10.5)

## 2013-09-23 LAB — BASIC METABOLIC PANEL
BUN: 11 mg/dL (ref 6–23)
CO2: 26 mEq/L (ref 19–32)
Calcium: 9.3 mg/dL (ref 8.4–10.5)
Chloride: 104 mEq/L (ref 96–112)
Creat: 0.73 mg/dL (ref 0.50–1.10)
Glucose, Bld: 81 mg/dL (ref 70–99)
Potassium: 4.3 mEq/L (ref 3.5–5.3)
SODIUM: 138 meq/L (ref 135–145)

## 2013-09-23 LAB — HEPATIC FUNCTION PANEL
ALBUMIN: 4.3 g/dL (ref 3.5–5.2)
ALK PHOS: 58 U/L (ref 39–117)
ALT: 14 U/L (ref 0–35)
AST: 19 U/L (ref 0–37)
Bilirubin, Direct: 0.1 mg/dL (ref 0.0–0.3)
Indirect Bilirubin: 0.3 mg/dL (ref 0.2–1.2)
Total Bilirubin: 0.4 mg/dL (ref 0.2–1.2)
Total Protein: 7.2 g/dL (ref 6.0–8.3)

## 2013-09-23 LAB — TSH: TSH: 1.646 u[IU]/mL (ref 0.350–4.500)

## 2013-09-27 IMAGING — CR DG ABDOMEN ACUTE W/ 1V CHEST
3 series · 3 of 3 positions shown · non-contrast
Comparison: None.

CLINICAL DATA: Abdominal pain.  History of ulcerative colitis.

ACUTE ABDOMEN SERIES (ABDOMEN 2 VIEW & CHEST 1 VIEW)

[w chest pa]
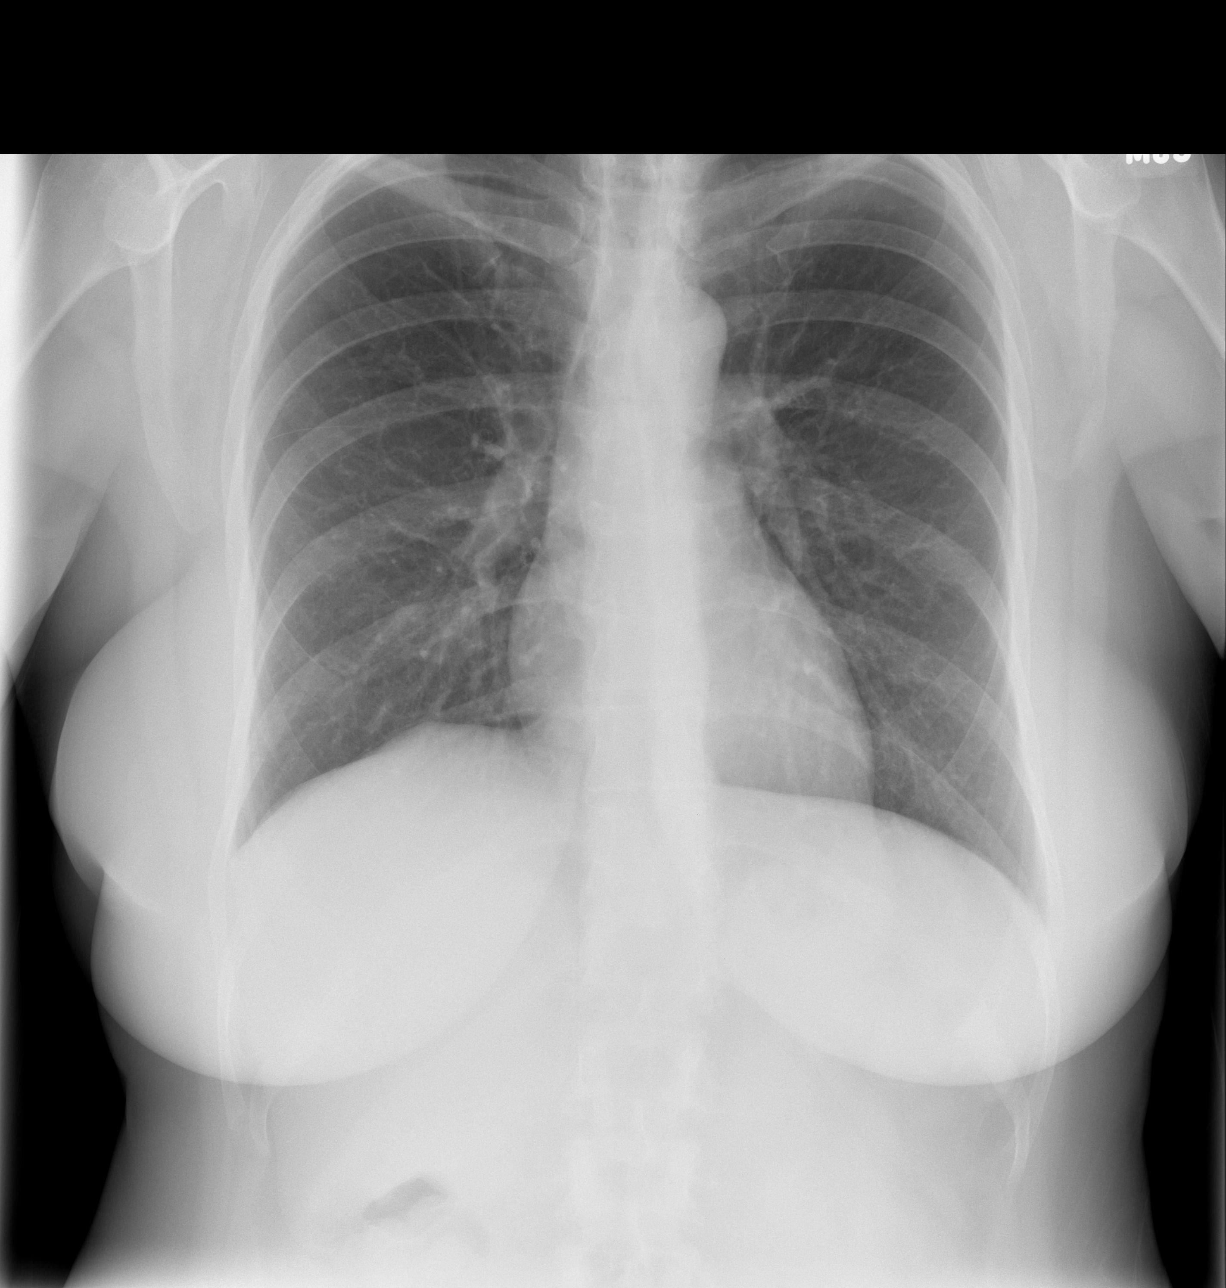

[w abdomen upright]
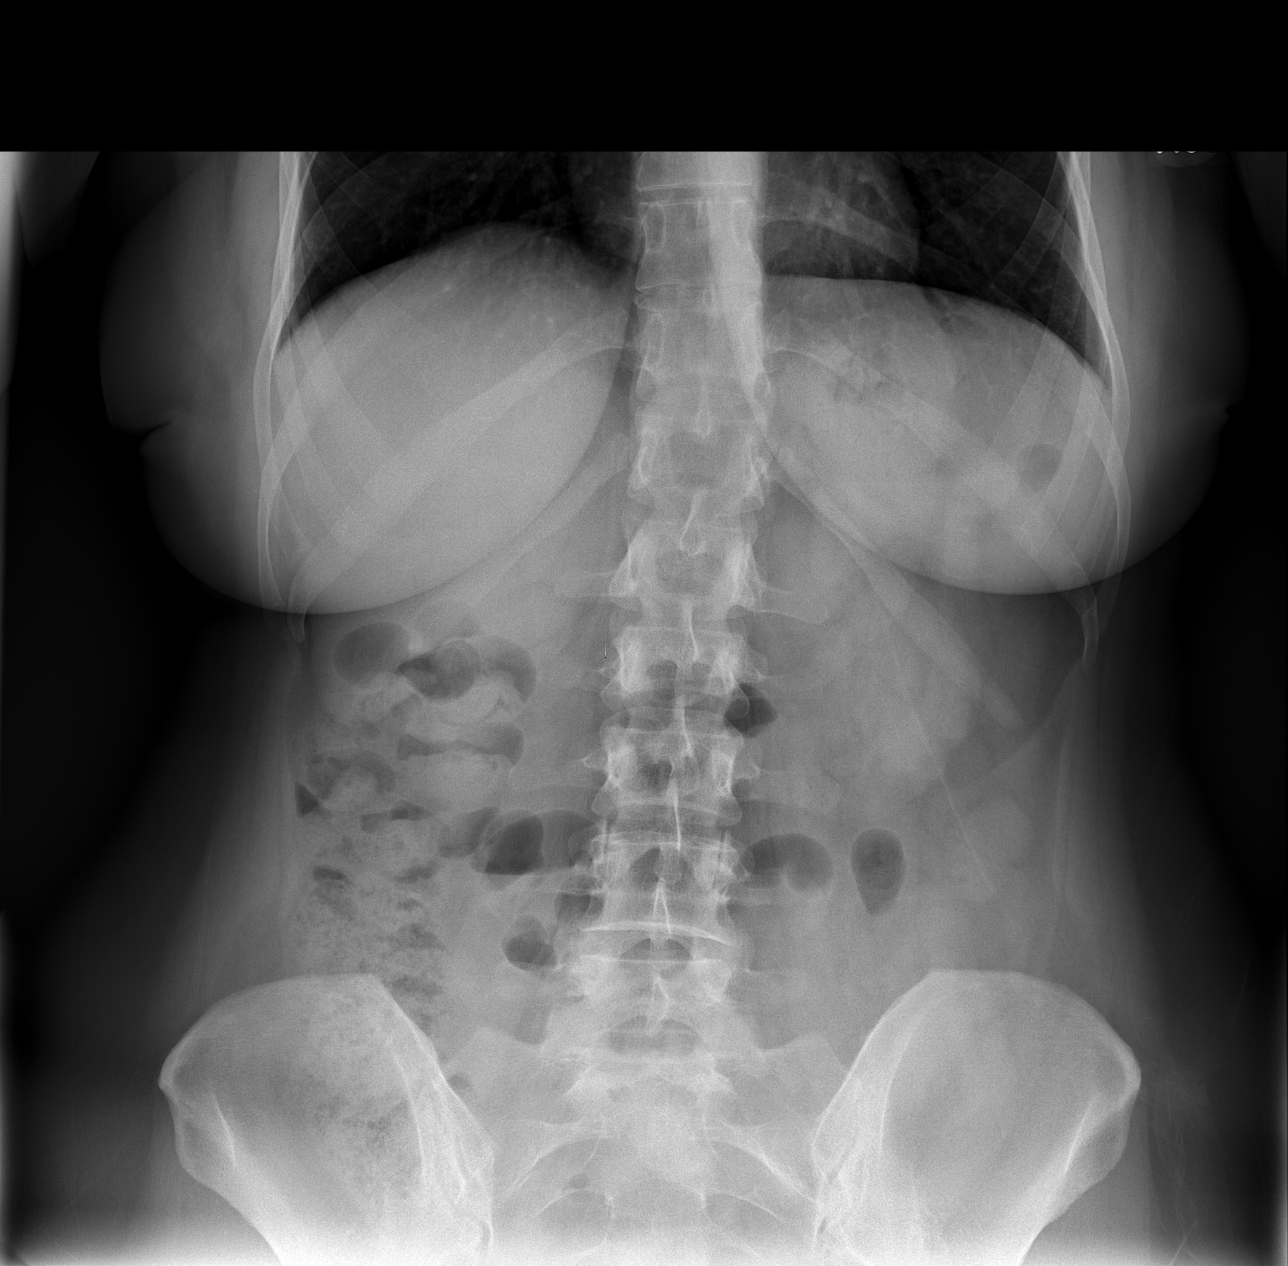

[t abdomen supine]
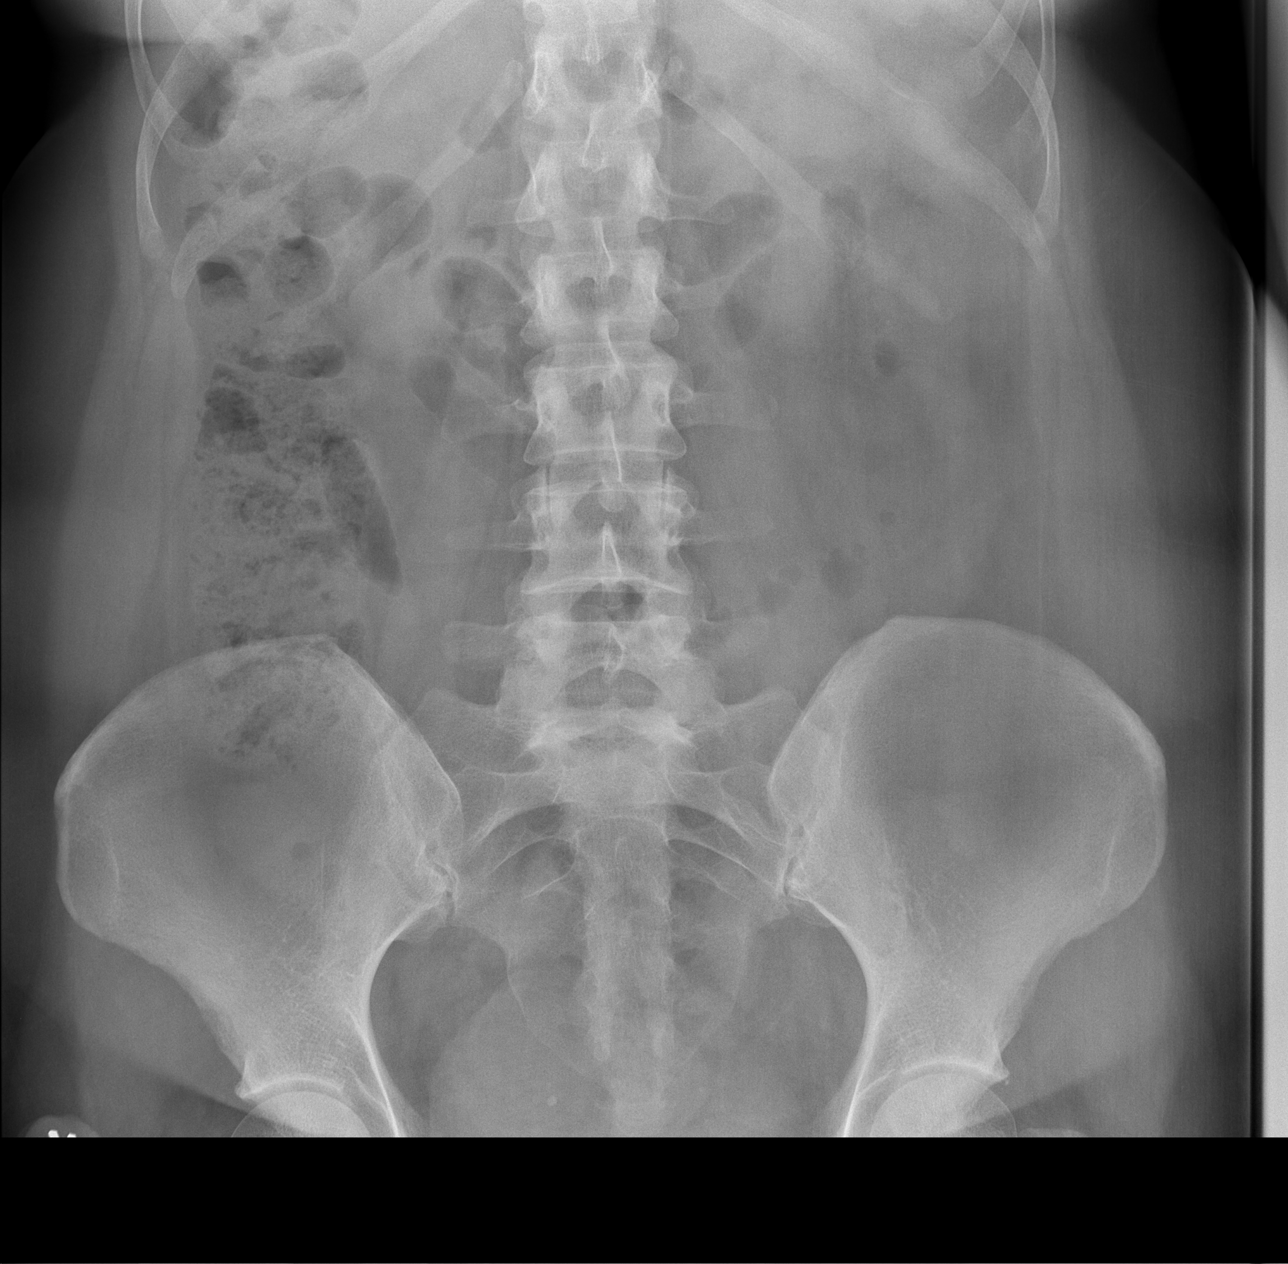

[3 of 3 positions shown; findings below may reference images not displayed]

FINDINGS: Single view of the chest demonstrates clear lungs and
normal heart size.  No pneumothorax or pleural fluid.

Two views of the abdomen show no free intraperitoneal air.
Moderate stool burden ascending colon.  No evidence of small bowel
obstruction.
IMPRESSION: No acute finding chest or abdomen.

## 2013-10-05 DIAGNOSIS — M130 Polyarthritis, unspecified: Secondary | ICD-10-CM | POA: Insufficient documentation

## 2013-10-05 DIAGNOSIS — F32A Depression, unspecified: Secondary | ICD-10-CM | POA: Insufficient documentation

## 2013-10-05 DIAGNOSIS — K5190 Ulcerative colitis, unspecified, without complications: Secondary | ICD-10-CM | POA: Insufficient documentation

## 2013-10-05 DIAGNOSIS — F329 Major depressive disorder, single episode, unspecified: Secondary | ICD-10-CM | POA: Insufficient documentation

## 2013-10-07 NOTE — Addendum Note (Signed)
Addended byCharolotte Capuchin D on: 10/07/2013 10:39 AM   Modules accepted: Orders

## 2013-10-07 NOTE — Progress Notes (Signed)
Patient was notified and verbalized understanding.

## 2013-10-07 NOTE — Progress Notes (Signed)
Referral in. Pt waiting to here back on appt info.

## 2013-11-09 ENCOUNTER — Encounter (HOSPITAL_COMMUNITY)
Admission: RE | Admit: 2013-11-09 | Discharge: 2013-11-09 | Disposition: A | Discharge status: Home / Self Care | Payer: BC Managed Care – PPO | Source: Ambulatory Visit | Attending: Gastroenterology | Admitting: Gastroenterology

## 2013-11-09 DIAGNOSIS — K5190 Ulcerative colitis, unspecified, without complications: Secondary | ICD-10-CM | POA: Insufficient documentation

## 2013-11-09 MED ORDER — SODIUM CHLORIDE 0.9 % IV SOLN
INTRAVENOUS | Status: DC
  Administered 2013-11-09: 250 mL via INTRAVENOUS

## 2013-11-09 MED ORDER — SODIUM CHLORIDE 0.9 % IV SOLN
5.0000 mg/kg | Freq: Once | INTRAVENOUS | Status: DC
  Administered 2013-11-09: 400 mg via INTRAVENOUS
  Filled 2013-11-09: qty 40

## 2013-11-09 MED ORDER — DIPHENHYDRAMINE HCL 25 MG PO CAPS: 25.0000 mg | ORAL_CAPSULE | Freq: Four times a day (QID) | ORAL | Status: DC | PRN

## 2013-11-09 MED ORDER — ACETAMINOPHEN 325 MG PO TABS
650.0000 mg | ORAL_TABLET | Freq: Four times a day (QID) | ORAL | Status: DC | PRN
Start: 2013-11-09 — End: 2013-11-10
  Administered 2013-11-09: 650 mg via ORAL

## 2013-11-09 MED ORDER — ACETAMINOPHEN 325 MG PO TABS
ORAL_TABLET | ORAL | Status: AC
  Filled 2013-11-09: qty 2

## 2013-11-16 ENCOUNTER — Encounter: Payer: Self-pay | Admitting: Family Medicine

## 2013-11-16 ENCOUNTER — Ambulatory Visit (INDEPENDENT_AMBULATORY_CARE_PROVIDER_SITE_OTHER): Payer: BC Managed Care – PPO | Admitting: Family Medicine

## 2013-11-16 ENCOUNTER — Ambulatory Visit (HOSPITAL_COMMUNITY)
Admission: RE | Admit: 2013-11-16 | Discharge: 2013-11-16 | Disposition: A | Discharge status: Home / Self Care | Payer: BC Managed Care – PPO | Source: Ambulatory Visit | Attending: Family Medicine | Admitting: Family Medicine

## 2013-11-16 VITALS — BP 132/82 | Temp 98.4°F | Ht 68.0 in | Wt 185.0 lb

## 2013-11-16 DIAGNOSIS — I10 Essential (primary) hypertension: Secondary | ICD-10-CM

## 2013-11-16 DIAGNOSIS — R059 Cough, unspecified: Secondary | ICD-10-CM

## 2013-11-16 DIAGNOSIS — R05 Cough: Secondary | ICD-10-CM

## 2013-11-16 MED ORDER — AMLODIPINE BESYLATE 2.5 MG PO TABS: 2.5000 mg | ORAL_TABLET | Freq: Every day | ORAL | Status: DC

## 2013-11-16 MED ORDER — BENZONATATE 100 MG PO CAPS: 100.0000 mg | ORAL_CAPSULE | Freq: Four times a day (QID) | ORAL | Status: DC | PRN

## 2013-11-16 NOTE — Progress Notes (Signed)
   Subjective:    Patient ID: Robin Gardner, female    DOB: Aug 06, 1965, 48 y.o.   MRN: [id number]  Cough This is a new problem. The current episode started more than 1 month ago. The problem has been gradually worsening. The problem occurs every few hours. The cough is non-productive. Associated symptoms include postnasal drip. Associated symptoms comments: Chest pressure this morning . The symptoms are aggravated by lying down. She has tried OTC cough suppressant for the symptoms. The treatment provided mild relief.   Was put on blood pressure medicine a few months ago  not around smoke  Review of Systems  HENT: Positive for postnasal drip.   Respiratory: Positive for cough.    Patient denies wheezing hemoptysis weight loss fever chills    Objective:   Physical Exam  lungs clear heart regular neck no masses throat normal eardrums normal blood pressure good       Assessment & Plan:  Cough related to medications stop Vasotec. In its place I would recommend trying amlodipine 2.5 mg 1 daily.  Recheck if progressive troubles  Recheck blood pressure in a few weeks time  Chest x-ray ordered because her cough for several weeks await results

## 2013-11-18 NOTE — Progress Notes (Signed)
Patient notified and verbalized understanding. 

## 2013-12-04 ENCOUNTER — Other Ambulatory Visit: Payer: Self-pay | Admitting: Family Medicine

## 2013-12-04 NOTE — Telephone Encounter (Signed)
Seen late may chronic viwit , 6 mo worth

## 2013-12-04 NOTE — Telephone Encounter (Signed)
Seen 09/22/13

## 2013-12-29 ENCOUNTER — Other Ambulatory Visit: Payer: Self-pay | Admitting: Family Medicine

## 2014-01-04 ENCOUNTER — Encounter (HOSPITAL_COMMUNITY)
Admission: RE | Admit: 2014-01-04 | Discharge: 2014-01-04 | Disposition: A | Discharge status: Home / Self Care | Payer: BC Managed Care – PPO | Source: Ambulatory Visit | Attending: Gastroenterology | Admitting: Gastroenterology

## 2014-01-04 DIAGNOSIS — K5190 Ulcerative colitis, unspecified, without complications: Secondary | ICD-10-CM | POA: Diagnosis present

## 2014-01-04 MED ORDER — ACETAMINOPHEN 325 MG PO TABS: 650.0000 mg | ORAL_TABLET | Freq: Four times a day (QID) | ORAL | Status: DC | PRN

## 2014-01-04 MED ORDER — DIPHENHYDRAMINE HCL 25 MG PO CAPS: 25.0000 mg | ORAL_CAPSULE | Freq: Four times a day (QID) | ORAL | Status: DC | PRN

## 2014-01-04 MED ORDER — SODIUM CHLORIDE 0.9 % IV SOLN
5.0000 mg/kg | Freq: Once | INTRAVENOUS | Status: AC
  Administered 2014-01-04: 400 mg via INTRAVENOUS
  Filled 2014-01-04: qty 40

## 2014-01-04 MED ORDER — SODIUM CHLORIDE 0.9 % IV SOLN
INTRAVENOUS | Status: DC
  Administered 2014-01-04: 250 mL via INTRAVENOUS

## 2014-01-08 ENCOUNTER — Other Ambulatory Visit: Payer: Self-pay | Admitting: Nurse Practitioner

## 2014-01-25 ENCOUNTER — Telehealth: Payer: Self-pay | Admitting: Family Medicine

## 2014-01-25 NOTE — Telephone Encounter (Signed)
Patient said that since last week, she has been having numbness from her shoulders down to her fingertips in her right arm.  Also, tingling around lips this morning.  She said the numbness was worse last week.  Also having ringing in her ears that won't stop.  Please advise.

## 2014-01-25 NOTE — Telephone Encounter (Signed)
Way too complic for phone management , rec ov this wk

## 2014-01-25 NOTE — Telephone Encounter (Signed)
Discussed with patient. Transferred to front to schedule an appt this week.

## 2014-01-26 ENCOUNTER — Encounter: Payer: Self-pay | Admitting: Family Medicine

## 2014-01-26 ENCOUNTER — Ambulatory Visit (INDEPENDENT_AMBULATORY_CARE_PROVIDER_SITE_OTHER): Payer: BC Managed Care – PPO | Admitting: Family Medicine

## 2014-01-26 VITALS — BP 120/78 | Ht 68.0 in | Wt 188.6 lb

## 2014-01-26 DIAGNOSIS — K5190 Ulcerative colitis, unspecified, without complications: Secondary | ICD-10-CM

## 2014-01-26 DIAGNOSIS — K219 Gastro-esophageal reflux disease without esophagitis: Secondary | ICD-10-CM

## 2014-01-26 DIAGNOSIS — H9319 Tinnitus, unspecified ear: Secondary | ICD-10-CM

## 2014-01-26 DIAGNOSIS — I10 Essential (primary) hypertension: Secondary | ICD-10-CM

## 2014-01-26 DIAGNOSIS — M130 Polyarthritis, unspecified: Secondary | ICD-10-CM

## 2014-01-26 DIAGNOSIS — Z23 Encounter for immunization: Secondary | ICD-10-CM

## 2014-01-26 MED ORDER — ALPRAZOLAM 0.5 MG PO TABS: ORAL_TABLET | ORAL | Status: DC

## 2014-01-26 MED ORDER — SERTRALINE HCL 50 MG PO TABS: ORAL_TABLET | ORAL | Status: DC

## 2014-01-26 MED ORDER — AMLODIPINE BESYLATE 2.5 MG PO TABS
2.5000 mg | ORAL_TABLET | Freq: Every day | ORAL | Status: DC
Start: 2014-01-26 — End: 2014-11-09

## 2014-01-26 MED ORDER — LEVOTHYROXINE SODIUM 75 MCG PO TABS: ORAL_TABLET | ORAL | Status: DC

## 2014-01-26 MED ORDER — PANTOPRAZOLE SODIUM 40 MG PO TBEC: 40.0000 mg | DELAYED_RELEASE_TABLET | Freq: Two times a day (BID) | ORAL | Status: DC

## 2014-01-26 NOTE — Progress Notes (Signed)
   Subjective:    Patient ID: Robin Gardner, female    DOB: June 02, 1965, 48 y.o.   MRN: [id number]  HPI Patient arrives with complaint of not feeling well lately. Patient states 2 days last week she had numbness and weakness in her right arm and around her mouth. Pt felt thick sensation in the throat, felt weird. Now better . Swallowing ok.  Numbing sens don the right arm and tingly. Patient wonders if it may be due to medication that she is on for alternative colitis.  Also has a history of anxiety and reports more stress lately.  Also around the lips.  Walking not much thesae dys   Patient also reports ringing and static in her ears. Patient also states that her throat felt thick last week- noticed when she swallowed. Overall that is better.  Feels better overall  Daytime  patient does have history of reflux. Claims compliance with diet.  number [phone]    Review of Systems no headache no chest pain no shortness breath no abdominal pain no change in bowel habits     Objective:   Physical Exam  Alert mild malaise. Lungs clear. Heart regular in rhythm. Sensation currently completely intact in both hands and around mouth. Neck supple. HEENT normal. Pharynx normal neck supple       Assessment & Plan:  Impression #1. Oral numbness. Etiology unclear. Advised patient to sometimes can occur with hyperventilation and anxiety she does report some stress. #2 reflux ongoing better. #3 nonspecific right hand numbness no loss of function or strength #4  Arthralgias presumably due to ulcerative colitis.#5 hypertension stable plan diet exercise discussed. Hold off on further worse now. Xanax when necessary for anxiety. Be sure to maintain Zoloft. Try to exercise. 25 minutes spent most in discussion. WSL

## 2014-02-10 ENCOUNTER — Telehealth: Payer: Self-pay | Admitting: Family Medicine

## 2014-02-10 DIAGNOSIS — H9319 Tinnitus, unspecified ear: Secondary | ICD-10-CM

## 2014-02-10 NOTE — Telephone Encounter (Addendum)
Referral ordered in Epic. Patient notified. 

## 2014-02-10 NOTE — Telephone Encounter (Signed)
According to pt at there last visit on 9/22 she was supposed to be  Referred to a hearing specialist because of her failing the hearing test  In her left ear? Pt does not have a referral in the system for this visit.   Please call pt back, she states that the ringing in her ears is driving her  Crazy at this point.   Thanks

## 2014-02-10 NOTE — Telephone Encounter (Signed)
ent ref  tinnitus

## 2014-03-03 ENCOUNTER — Encounter (HOSPITAL_COMMUNITY)
Admission: RE | Admit: 2014-03-03 | Discharge: 2014-03-03 | Disposition: A | Discharge status: Home / Self Care | Payer: BLUE CROSS/BLUE SHIELD | Source: Ambulatory Visit | Attending: Gastroenterology | Admitting: Gastroenterology

## 2014-03-03 NOTE — Progress Notes (Signed)
Dr. Cristina Gong called and stated to hold remicade infusion.  Patient to see ENT for referral and then back to Dr. Cristina Gong.  Patient will call and reschedule.

## 2014-03-07 DIAGNOSIS — Z029 Encounter for administrative examinations, unspecified: Secondary | ICD-10-CM

## 2014-03-11 ENCOUNTER — Ambulatory Visit (INDEPENDENT_AMBULATORY_CARE_PROVIDER_SITE_OTHER): Payer: BC Managed Care – PPO | Admitting: Otolaryngology

## 2014-03-11 DIAGNOSIS — H9313 Tinnitus, bilateral: Secondary | ICD-10-CM

## 2014-03-11 DIAGNOSIS — H903 Sensorineural hearing loss, bilateral: Secondary | ICD-10-CM

## 2014-03-16 ENCOUNTER — Other Ambulatory Visit (HOSPITAL_COMMUNITY): Payer: Self-pay | Admitting: *Deleted

## 2014-03-17 ENCOUNTER — Encounter (HOSPITAL_COMMUNITY)
Admission: RE | Admit: 2014-03-17 | Discharge: 2014-03-17 | Disposition: A | Discharge status: Home / Self Care | Payer: BC Managed Care – PPO | Source: Ambulatory Visit | Attending: Gastroenterology | Admitting: Gastroenterology

## 2014-03-17 DIAGNOSIS — K5190 Ulcerative colitis, unspecified, without complications: Secondary | ICD-10-CM | POA: Insufficient documentation

## 2014-03-17 MED ORDER — DIPHENHYDRAMINE HCL 25 MG PO TABS
25.0000 mg | ORAL_TABLET | Freq: Once | ORAL | Status: DC
  Filled 2014-03-17: qty 1

## 2014-03-17 MED ORDER — SODIUM CHLORIDE 0.9 % IV SOLN
INTRAVENOUS | Status: DC
  Administered 2014-03-17: 12:00:00 via INTRAVENOUS

## 2014-03-17 MED ORDER — SODIUM CHLORIDE 0.9 % IV SOLN
5.0000 mg/kg | INTRAVENOUS | Status: DC
  Administered 2014-03-17: 400 mg via INTRAVENOUS
  Filled 2014-03-17: qty 40

## 2014-03-17 MED ORDER — ACETAMINOPHEN 325 MG PO TABS
ORAL_TABLET | ORAL | Status: AC
  Administered 2014-03-17: 650 mg
  Filled 2014-03-17: qty 2

## 2014-03-17 MED ORDER — ACETAMINOPHEN 325 MG PO TABS: 650.0000 mg | ORAL_TABLET | Freq: Once | ORAL | Status: DC

## 2014-03-22 ENCOUNTER — Ambulatory Visit: Payer: BC Managed Care – PPO | Admitting: Nurse Practitioner

## 2014-03-25 ENCOUNTER — Ambulatory Visit (INDEPENDENT_AMBULATORY_CARE_PROVIDER_SITE_OTHER): Payer: BC Managed Care – PPO | Admitting: Nurse Practitioner

## 2014-03-25 ENCOUNTER — Encounter: Payer: Self-pay | Admitting: Nurse Practitioner

## 2014-03-25 VITALS — BP 122/80 | Ht 68.0 in | Wt 187.2 lb

## 2014-03-25 DIAGNOSIS — F329 Major depressive disorder, single episode, unspecified: Secondary | ICD-10-CM

## 2014-03-25 DIAGNOSIS — M609 Myositis, unspecified: Secondary | ICD-10-CM

## 2014-03-25 DIAGNOSIS — F32A Depression, unspecified: Secondary | ICD-10-CM

## 2014-03-25 DIAGNOSIS — IMO0001 Reserved for inherently not codable concepts without codable children: Secondary | ICD-10-CM

## 2014-03-25 DIAGNOSIS — M791 Myalgia: Secondary | ICD-10-CM

## 2014-03-25 DIAGNOSIS — I10 Essential (primary) hypertension: Secondary | ICD-10-CM

## 2014-03-25 DIAGNOSIS — M2550 Pain in unspecified joint: Secondary | ICD-10-CM

## 2014-03-26 LAB — RHEUMATOID FACTOR: Rhuematoid fact SerPl-aCnc: <10 IU/mL (ref <=14)

## 2014-03-26 LAB — ANA: Anti Nuclear Antibody(ANA): NEGATIVE

## 2014-03-26 LAB — SEDIMENTATION RATE: Sed Rate: 11 mm/hr (ref 0–22)

## 2014-03-29 ENCOUNTER — Encounter: Payer: Self-pay | Admitting: Nurse Practitioner

## 2014-03-29 NOTE — Progress Notes (Signed)
Subjective:  Presents for recheck of her blood pressure. No chest pain/ischemic type pain or unusual shortness of breath. Mentions that she's had a flareup of her muscle and joint pain. This began around 10/30. Migratory. Effects upper and lower body both sides. Symptoms are particularly worse in the hands at times. Has noticed "swelling and knots". No fever. No known triggers. Would also like to get mental health counseling with another provider.  Objective:   BP 122/80 mmHg  Ht '5\' 8"'  (1.727 m)  Wt 187 lb 4 oz (84.936 kg)  BMI 28.48 kg/m2 NAD. Alert, oriented. Lungs clear. Heart regular rate rhythm. No edema or erythema of the hands at this time. Mildly anxious affect. Thoughts logical coherent and relevant. Dressed appropriately.  Assessment:  Problem List Items Addressed This Visit      Cardiovascular and Mediastinum   Essential hypertension, benign - Primary     Other   Depression    Other Visit Diagnoses    Arthralgia        Relevant Orders       Sed Rate (ESR) (Completed)       Antinuclear Antib (ANA) (Completed)       Rheumatoid factor (Completed)    Myalgia and myositis        Relevant Orders       Sed Rate (ESR) (Completed)       Antinuclear Antib (ANA) (Completed)       Rheumatoid factor (Completed)       Plan: Refer for mental health counseling. Refer to rheumatologist for further evaluation. Lab work pending. Return in about 3 months (around 06/25/2014).

## 2014-04-23 ENCOUNTER — Encounter: Payer: Self-pay | Admitting: Family Medicine

## 2014-05-10 ENCOUNTER — Other Ambulatory Visit: Payer: Self-pay | Admitting: *Deleted

## 2014-05-10 ENCOUNTER — Telehealth: Payer: Self-pay | Admitting: Family Medicine

## 2014-05-10 DIAGNOSIS — F32A Depression, unspecified: Secondary | ICD-10-CM

## 2014-05-10 DIAGNOSIS — F329 Major depressive disorder, single episode, unspecified: Secondary | ICD-10-CM

## 2014-05-10 NOTE — Telephone Encounter (Signed)
Referral put in. Pt notified.  

## 2014-05-10 NOTE — Telephone Encounter (Signed)
Pt was to be referred to a phsyc appt here in [place] Note in [place] from 11/19 but was not put in   Please submit

## 2014-05-12 ENCOUNTER — Encounter (HOSPITAL_COMMUNITY)
Admission: RE | Admit: 2014-05-12 | Discharge: 2014-05-12 | Disposition: A | Discharge status: Home / Self Care | Payer: BLUE CROSS/BLUE SHIELD | Source: Ambulatory Visit | Attending: Gastroenterology | Admitting: Gastroenterology

## 2014-05-12 DIAGNOSIS — K5190 Ulcerative colitis, unspecified, without complications: Secondary | ICD-10-CM | POA: Diagnosis present

## 2014-05-12 MED ORDER — DIPHENHYDRAMINE HCL 25 MG PO TABS
25.0000 mg | ORAL_TABLET | Freq: Once | ORAL | Status: DC
Start: 2014-05-12 — End: 2014-05-13
  Filled 2014-05-12: qty 1

## 2014-05-12 MED ORDER — DIPHENHYDRAMINE HCL 25 MG PO CAPS
ORAL_CAPSULE | ORAL | Status: AC
  Filled 2014-05-12: qty 1

## 2014-05-12 MED ORDER — SODIUM CHLORIDE 0.9 % IV SOLN: INTRAVENOUS | Status: DC

## 2014-05-12 MED ORDER — ACETAMINOPHEN 325 MG PO TABS: 650.0000 mg | ORAL_TABLET | Freq: Once | ORAL | Status: DC

## 2014-05-12 MED ORDER — SODIUM CHLORIDE 0.9 % IV SOLN
5.0000 mg/kg | INTRAVENOUS | Status: DC
  Administered 2014-05-12: 400 mg via INTRAVENOUS
  Filled 2014-05-12: qty 40

## 2014-05-12 MED ORDER — ACETAMINOPHEN 325 MG PO TABS
ORAL_TABLET | ORAL | Status: AC
  Filled 2014-05-12: qty 2

## 2014-06-17 ENCOUNTER — Ambulatory Visit (HOSPITAL_COMMUNITY): Payer: Self-pay | Admitting: Psychiatry

## 2014-06-25 ENCOUNTER — Ambulatory Visit: Payer: BC Managed Care – PPO | Admitting: Family Medicine

## 2014-06-26 IMAGING — CR DG FOOT COMPLETE 3+V*R*
3 series · 3 of 3 positions shown · non-contrast
Comparison: None.

CLINICAL DATA: Hallux minus deformity.

RIGHT FOOT COMPLETE - 3+ VIEW

[view not recorded (1 of 3)]
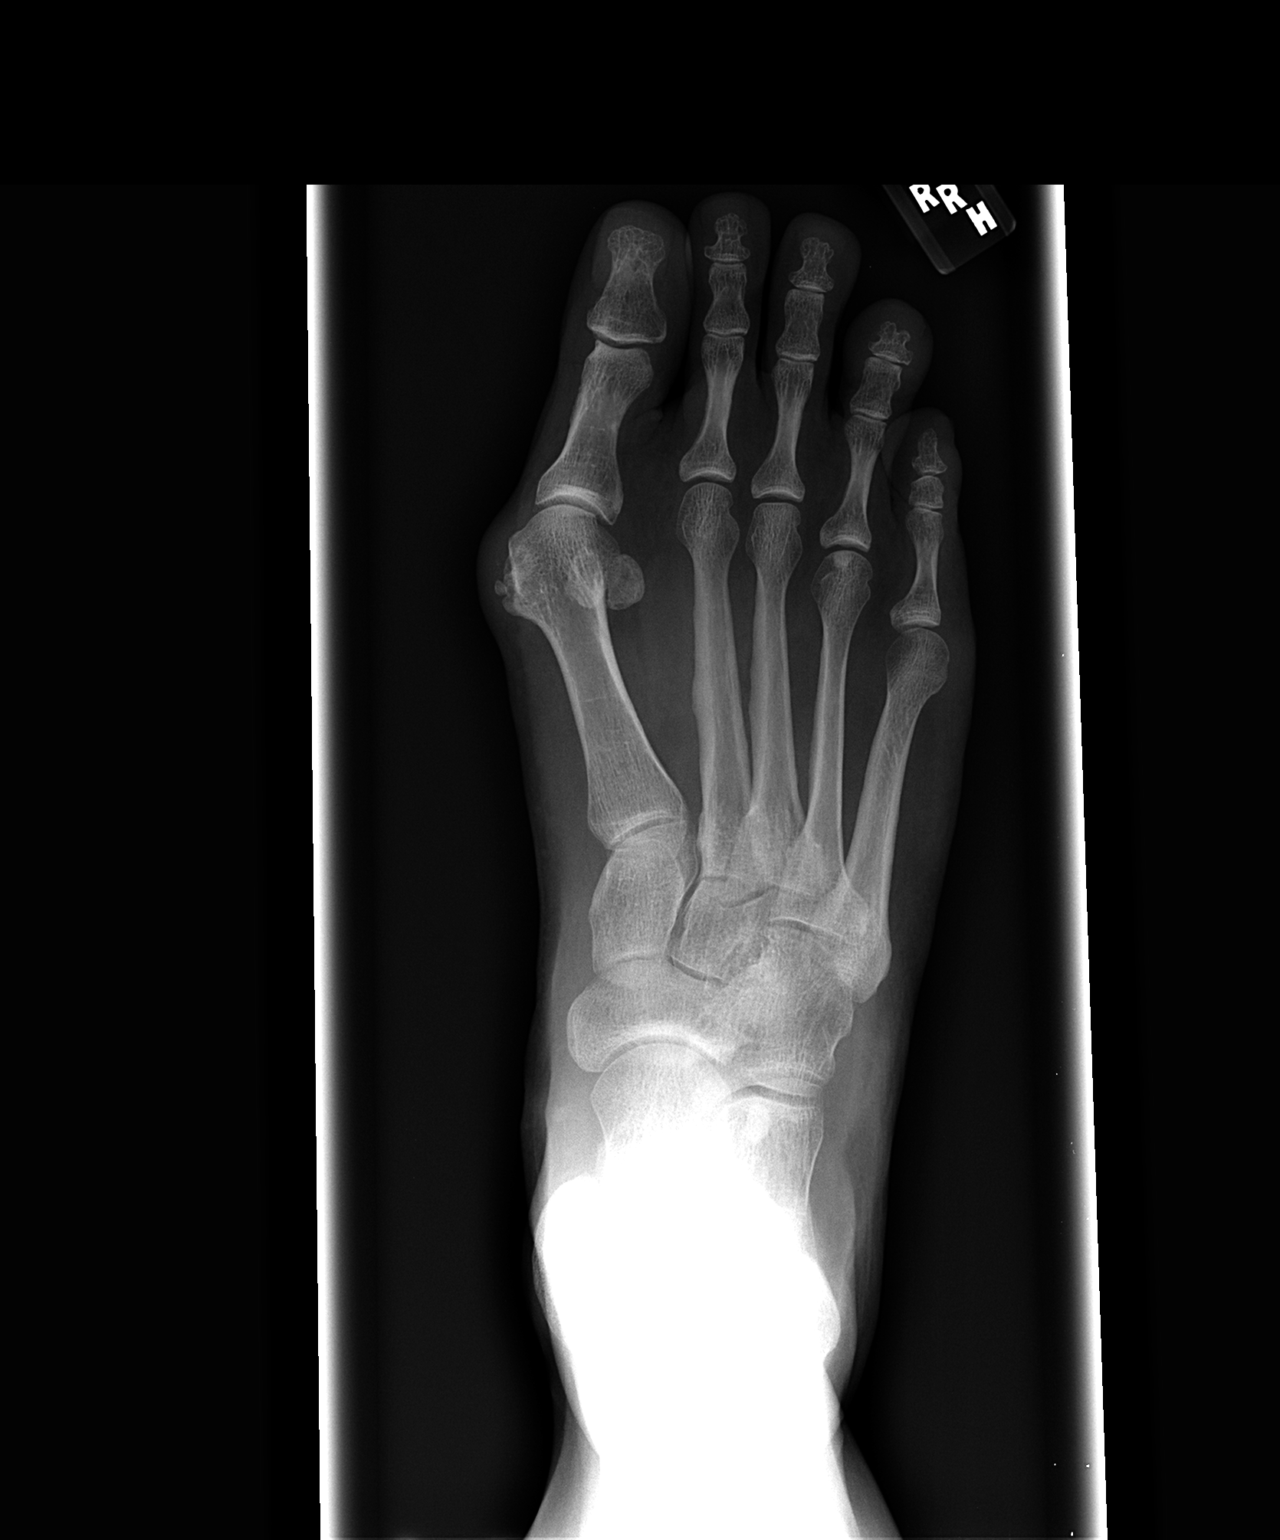

[view not recorded (2 of 3)]
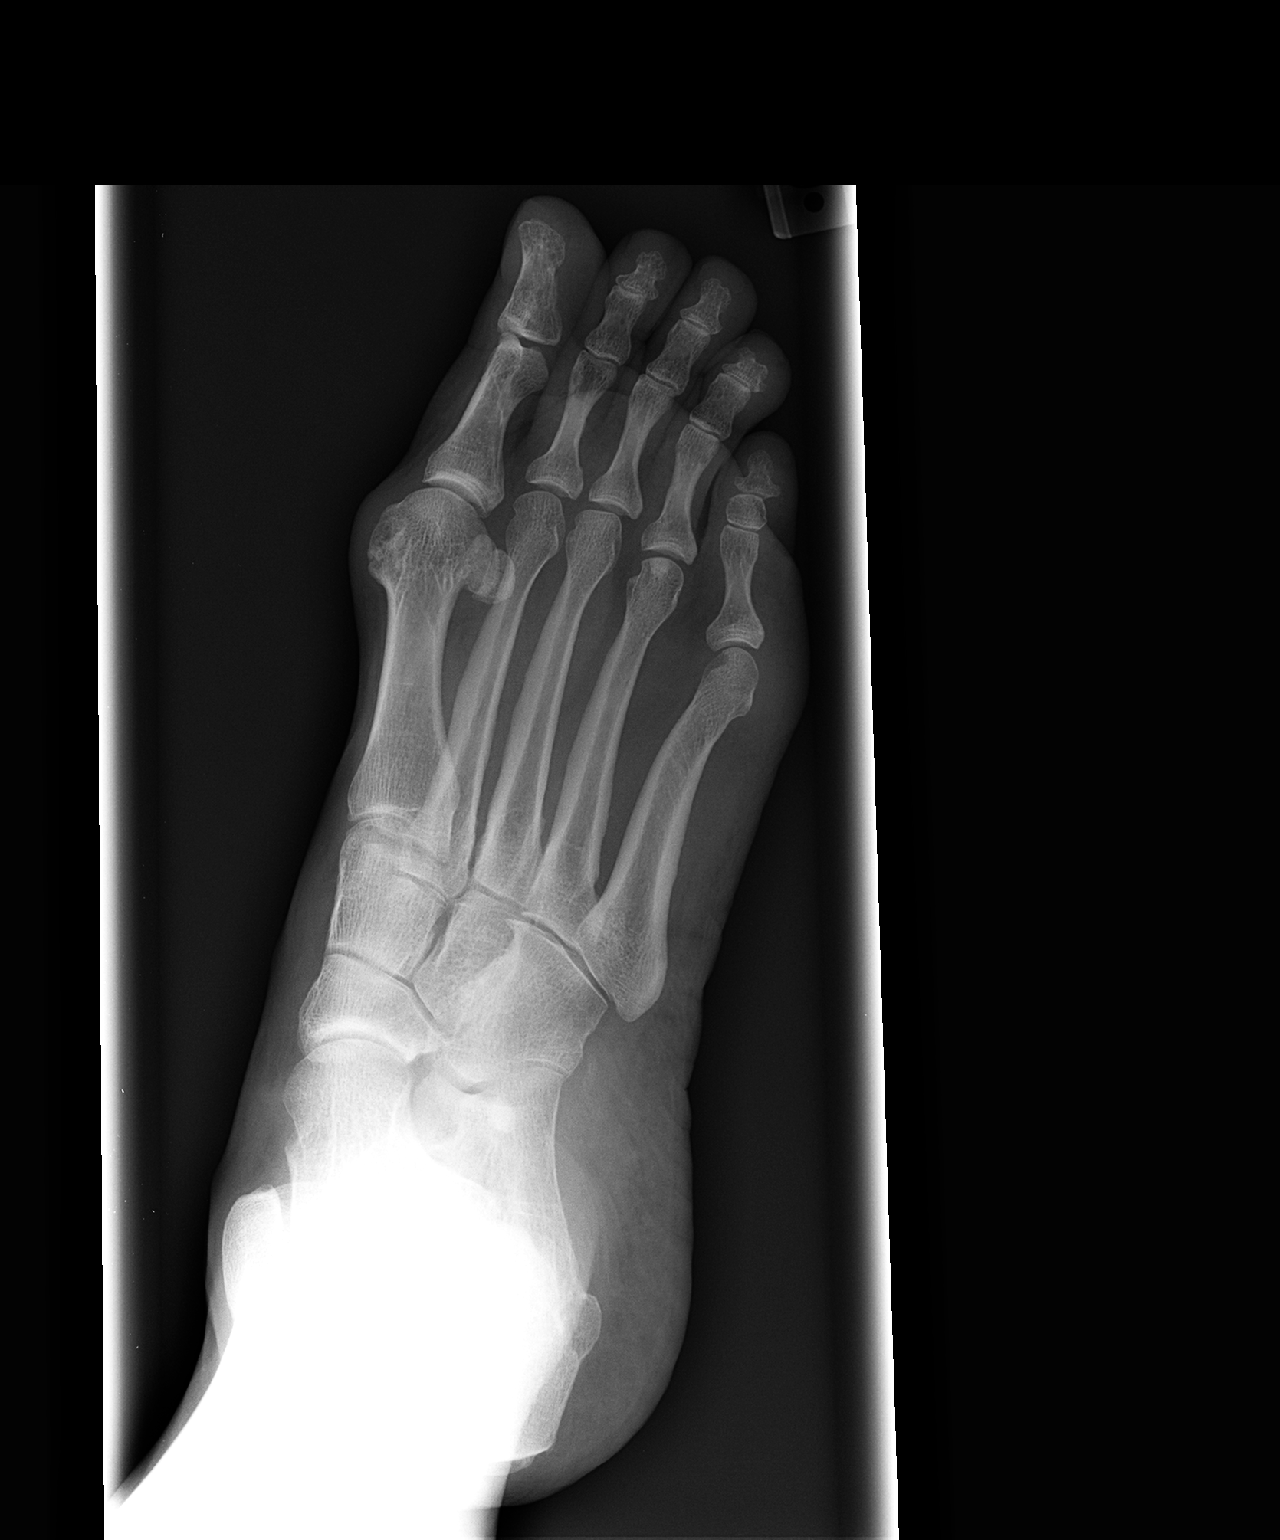

[view not recorded (3 of 3)]
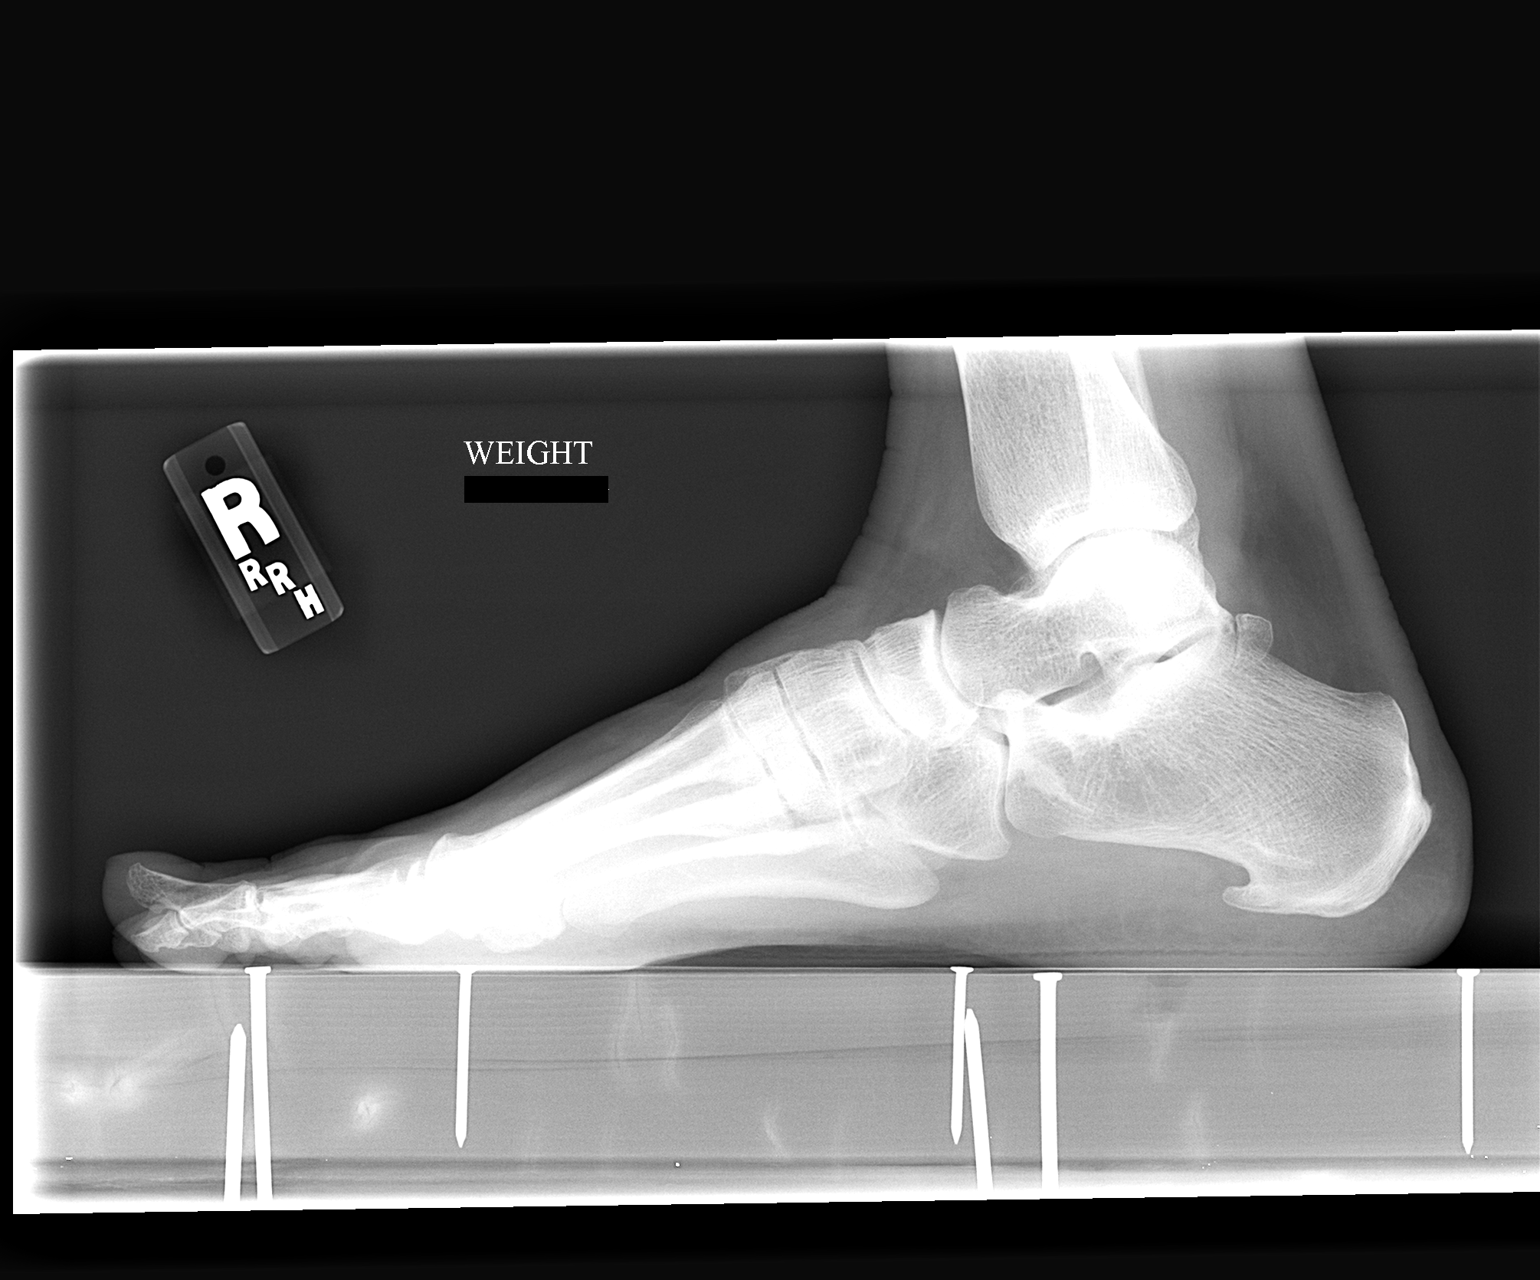

[3 of 3 positions shown; findings below may reference images not displayed]

FINDINGS: 34 degree as angulation is evident to at the first MTP
joint.  There is some flattening of the normal plantar arch.  A
prominent plantar calcaneal spur is evident.
IMPRESSION: 1.  Hallux valgus deformity with 34 degrees angulation.
2.  Prominent plantar calcaneal spur.

## 2014-07-07 ENCOUNTER — Encounter (HOSPITAL_COMMUNITY): Payer: Self-pay

## 2014-07-20 ENCOUNTER — Other Ambulatory Visit (HOSPITAL_COMMUNITY): Payer: Self-pay | Admitting: *Deleted

## 2014-07-21 ENCOUNTER — Encounter (HOSPITAL_COMMUNITY)
Admission: RE | Admit: 2014-07-21 | Discharge: 2014-07-21 | Disposition: A | Discharge status: Home / Self Care | Payer: BLUE CROSS/BLUE SHIELD | Source: Ambulatory Visit | Attending: Gastroenterology | Admitting: Gastroenterology

## 2014-07-21 DIAGNOSIS — K5190 Ulcerative colitis, unspecified, without complications: Secondary | ICD-10-CM | POA: Insufficient documentation

## 2014-07-21 MED ORDER — RANITIDINE HCL 150 MG/10ML PO SYRP
150.0000 mg | ORAL_SOLUTION | ORAL | Status: DC
  Filled 2014-07-21: qty 10

## 2014-07-21 MED ORDER — SODIUM CHLORIDE 0.9 % IV SOLN
INTRAVENOUS | Status: DC
  Administered 2014-07-21: 250 mL via INTRAVENOUS

## 2014-07-21 MED ORDER — ACETAMINOPHEN 325 MG PO TABS
650.0000 mg | ORAL_TABLET | ORAL | Status: DC
  Administered 2014-07-21: 650 mg via ORAL

## 2014-07-21 MED ORDER — SODIUM CHLORIDE 0.9 % IV SOLN
8.0000 mg/kg | INTRAVENOUS | Status: DC
  Administered 2014-07-21: 700 mg via INTRAVENOUS
  Filled 2014-07-21: qty 70

## 2014-07-21 MED ORDER — ACETAMINOPHEN 325 MG PO TABS
ORAL_TABLET | ORAL | Status: AC
  Administered 2014-07-21: 650 mg via ORAL
  Filled 2014-07-21: qty 2

## 2014-07-22 ENCOUNTER — Encounter: Payer: Self-pay | Admitting: Family Medicine

## 2014-07-22 ENCOUNTER — Ambulatory Visit (INDEPENDENT_AMBULATORY_CARE_PROVIDER_SITE_OTHER): Payer: BLUE CROSS/BLUE SHIELD | Admitting: Family Medicine

## 2014-07-22 VITALS — BP 112/80 | Temp 98.3°F | Ht 68.0 in | Wt 188.2 lb

## 2014-07-22 DIAGNOSIS — I889 Nonspecific lymphadenitis, unspecified: Secondary | ICD-10-CM

## 2014-07-22 MED ORDER — AZITHROMYCIN 250 MG PO TABS: ORAL_TABLET | ORAL | Status: DC

## 2014-07-22 NOTE — Progress Notes (Signed)
   Subjective:    Patient ID: Robin Gardner, female    DOB: [birth year], 49 y.o.   MRN: [id number]  Otalgia  There is pain in the left ear. This is a new problem. The current episode started in the past 7 days. The problem occurs constantly. The problem has been unchanged. There has been no fever. Associated symptoms include neck pain. She has tried acetaminophen for the symptoms. The treatment provided mild relief.   Patient states that she has no other concerns at this time.  !38 over 88  Pain under jaw and in the neck  No fever or chills  No cong stuffiness out of the ordinaryy  Poss assoc with chewing   Taking tylenol Review of Systems  HENT: Positive for ear pain.   Musculoskeletal: Positive for neck pain.       Objective:   Physical Exam Alert mild malaise tympanic membranes normal pharynx normal neck supple left anterior tender lymph node swollen Lungs clear. Heart regular in rhythm.      Assessment & Plan:  Impression cervical lymphadenitis plan Z-Pak. Symptomatic care discussed. WSL

## 2014-07-26 ENCOUNTER — Other Ambulatory Visit: Payer: Self-pay | Admitting: Family Medicine

## 2014-07-27 NOTE — Telephone Encounter (Signed)
Ok plus five monthly ref 

## 2014-08-17 ENCOUNTER — Encounter: Payer: Self-pay | Admitting: Family Medicine

## 2014-08-17 ENCOUNTER — Ambulatory Visit (INDEPENDENT_AMBULATORY_CARE_PROVIDER_SITE_OTHER): Payer: BLUE CROSS/BLUE SHIELD | Admitting: Family Medicine

## 2014-08-17 VITALS — BP 130/90 | Ht 68.0 in | Wt 191.0 lb

## 2014-08-17 DIAGNOSIS — K51919 Ulcerative colitis, unspecified with unspecified complications: Secondary | ICD-10-CM | POA: Diagnosis not present

## 2014-08-17 DIAGNOSIS — E039 Hypothyroidism, unspecified: Secondary | ICD-10-CM | POA: Diagnosis not present

## 2014-08-17 DIAGNOSIS — Z13220 Encounter for screening for lipoid disorders: Secondary | ICD-10-CM | POA: Diagnosis not present

## 2014-08-17 DIAGNOSIS — F329 Major depressive disorder, single episode, unspecified: Secondary | ICD-10-CM

## 2014-08-17 DIAGNOSIS — I10 Essential (primary) hypertension: Secondary | ICD-10-CM | POA: Diagnosis not present

## 2014-08-17 DIAGNOSIS — M130 Polyarthritis, unspecified: Secondary | ICD-10-CM

## 2014-08-17 DIAGNOSIS — F32A Depression, unspecified: Secondary | ICD-10-CM

## 2014-08-17 MED ORDER — SERTRALINE HCL 100 MG PO TABS: ORAL_TABLET | ORAL | Status: DC

## 2014-08-17 MED ORDER — ALPRAZOLAM 0.5 MG PO TABS: ORAL_TABLET | ORAL | Status: DC

## 2014-08-17 NOTE — Progress Notes (Signed)
   Subjective:    Patient ID: Robin Gardner, female    DOB: 10-Sep-1965, 49 y.o.   MRN: [id number]  HPI Pt would like her TSH checked. She believes she is having thyroid issues.not missing doses on the thyroid meds.    Pt's s/s are: Fatigue, brittle/cracked nails, dry skin, hair thinning and falling out. Skin dry hair falling out, fatigue,   Pt has had these s/s for at least 6 months.  remicade dosage was increased, [facility] infusion [facility] and was very tired with the injec, very sleepy for about a week  Compliant with b p meds, no obv s e's . Watching salt intake. Blood pressures generally good.  Worsening depression feeling down. No suicidal thoughts.  Challenges with reflux though fairly stable.  Continues to see rheumatology specialist March 16 th,  BP generally fairly good, runs hi on inf day.    Review of Systems No headache obvious joint discomfort positive fatigue no weight loss no weight gain    Objective:   Physical Exam  Alert mild malaise vital stable thyroid nonpalpable H&T normal skin dry lungs clear heart regular in rhythm ankles without edema      Assessment & Plan:  Impression 1 hypothyroidism status uncertain #2 hypertension controlled decent #3 also toe colitis with ongoing challenges discussed #4 depression worsening plan increase Zoloft rationale discussed. Check appropriate blood work. Diet exercise discussed. Maintain other medications. Recheck in 6 months. WSL

## 2014-08-19 LAB — BASIC METABOLIC PANEL
BUN/Creatinine Ratio: 11 (ref 9–23)
BUN: 10 mg/dL (ref 6–24)
CALCIUM: 9.4 mg/dL (ref 8.7–10.2)
CO2: 22 mmol/L (ref 18–29)
CREATININE: 0.87 mg/dL (ref 0.57–1.00)
Chloride: 100 mmol/L (ref 97–108)
GFR, EST AFRICAN AMERICAN: 90 mL/min/{1.73_m2} (ref >59)
GFR, EST NON AFRICAN AMERICAN: 78 mL/min/{1.73_m2} (ref >59)
GLUCOSE: 90 mg/dL (ref 65–99)
Potassium: 4.5 mmol/L (ref 3.5–5.2)
Sodium: 139 mmol/L (ref 134–144)

## 2014-08-19 LAB — LIPID PANEL
CHOL/HDL RATIO: 6.3 ratio — AB (ref 0.0–4.4)
Cholesterol, Total: 215 mg/dL — ABNORMAL HIGH (ref 100–199)
HDL: 34 mg/dL — ABNORMAL LOW (ref >39)
LDL Calculated: 131 mg/dL — ABNORMAL HIGH (ref 0–99)
TRIGLYCERIDES: 252 mg/dL — AB (ref 0–149)
VLDL Cholesterol Cal: 50 mg/dL — ABNORMAL HIGH (ref 5–40)

## 2014-08-19 LAB — T4: T4 TOTAL: 9.4 ug/dL (ref 4.5–12.0)

## 2014-08-19 LAB — TSH: TSH: 2.86 u[IU]/mL (ref 0.450–4.500)

## 2014-08-20 ENCOUNTER — Encounter: Payer: Self-pay | Admitting: Family Medicine

## 2014-08-24 ENCOUNTER — Other Ambulatory Visit: Payer: Self-pay | Admitting: Family Medicine

## 2014-09-14 ENCOUNTER — Other Ambulatory Visit (HOSPITAL_COMMUNITY): Payer: Self-pay | Admitting: *Deleted

## 2014-09-15 ENCOUNTER — Encounter (HOSPITAL_COMMUNITY)
Admission: RE | Admit: 2014-09-15 | Discharge: 2014-09-15 | Disposition: A | Discharge status: Home / Self Care | Payer: BLUE CROSS/BLUE SHIELD | Source: Ambulatory Visit | Attending: Gastroenterology | Admitting: Gastroenterology

## 2014-09-15 DIAGNOSIS — K5190 Ulcerative colitis, unspecified, without complications: Secondary | ICD-10-CM | POA: Diagnosis present

## 2014-09-15 MED ORDER — SODIUM CHLORIDE 0.9 % IV SOLN
INTRAVENOUS | Status: AC
  Administered 2014-09-15: 250 mL via INTRAVENOUS

## 2014-09-15 MED ORDER — ACETAMINOPHEN 325 MG PO TABS
650.0000 mg | ORAL_TABLET | ORAL | Status: AC
Start: 2014-09-15 — End: 2014-09-15
  Administered 2014-09-15: 650 mg via ORAL

## 2014-09-15 MED ORDER — SODIUM CHLORIDE 0.9 % IV SOLN
8.0000 mg/kg | INTRAVENOUS | Status: AC
  Administered 2014-09-15: 700 mg via INTRAVENOUS
  Filled 2014-09-15: qty 70

## 2014-09-15 MED ORDER — FAMOTIDINE 40 MG/5ML PO SUSR
20.0000 mg | Freq: Once | ORAL | Status: AC
  Administered 2014-09-15: 20 mg via ORAL
  Filled 2014-09-15: qty 2.5

## 2014-09-15 MED ORDER — RANITIDINE HCL 150 MG/10ML PO SYRP
150.0000 mg | ORAL_SOLUTION | ORAL | Status: DC
Start: 2014-09-15 — End: 2014-09-15

## 2014-09-22 ENCOUNTER — Encounter: Payer: Self-pay | Admitting: Family Medicine

## 2014-09-22 ENCOUNTER — Ambulatory Visit (INDEPENDENT_AMBULATORY_CARE_PROVIDER_SITE_OTHER): Payer: BLUE CROSS/BLUE SHIELD | Admitting: Family Medicine

## 2014-09-22 VITALS — BP 122/90 | Temp 99.2°F | Ht 68.0 in | Wt 186.0 lb

## 2014-09-22 DIAGNOSIS — J00 Acute nasopharyngitis [common cold]: Secondary | ICD-10-CM

## 2014-09-22 DIAGNOSIS — J209 Acute bronchitis, unspecified: Secondary | ICD-10-CM

## 2014-09-22 MED ORDER — CLARITHROMYCIN 500 MG PO TABS: 500.0000 mg | ORAL_TABLET | Freq: Two times a day (BID) | ORAL | Status: AC

## 2014-09-22 MED ORDER — HYDROCODONE-HOMATROPINE 5-1.5 MG/5ML PO SYRP: ORAL_SOLUTION | ORAL | Status: DC

## 2014-09-22 NOTE — Progress Notes (Signed)
   Subjective:    Patient ID: Robin Gardner, female    DOB: 07/07/1965, 49 y.o.   MRN: [id number]  HPI Patient here for non-productive cough times several weeks with cough   worsening daily and especially at night. Temp 99.2 today. Patient c/o upper thoracic soreness onset after coughing.   Last few nights at night,  Dim energy  achey  Tessalon perles qhs for cough   Patient on immune suppressant medication.  Review of Systems No weight loss minimal anterior chest pain some upper active discomfort worse with motion or deep breath no high fevers no chills no obvious wheezes    Objective:   Physical Exam  Alert mild malaise vitals stable HEENT normal lungs no wheezes some rhonchi no crackles intermittent bronchial cough heart regular in rhythm      Assessment & Plan:  Impression subacute bronchitis on immunotherapy plan nighttime Hycodan when necessary for cough. Biaxin twice a day 10 days. Symptom care discussed WSL

## 2014-10-01 ENCOUNTER — Telehealth: Payer: Self-pay | Admitting: Family Medicine

## 2014-10-01 MED ORDER — FLUCONAZOLE 150 MG PO TABS: 150.0000 mg | ORAL_TABLET | Freq: Every day | ORAL | Status: DC

## 2014-10-01 NOTE — Telephone Encounter (Signed)
Pt is needing something called in for a yeast infection.    [employer]

## 2014-10-01 NOTE — Telephone Encounter (Signed)
Left message on voicemail notifying patient that med was sent to pharmacy.

## 2014-10-11 ENCOUNTER — Encounter: Payer: Self-pay | Admitting: Family Medicine

## 2014-10-11 ENCOUNTER — Ambulatory Visit (INDEPENDENT_AMBULATORY_CARE_PROVIDER_SITE_OTHER): Payer: BLUE CROSS/BLUE SHIELD | Admitting: Family Medicine

## 2014-10-11 VITALS — BP 134/86 | Temp 98.3°F | Ht 68.0 in | Wt 183.0 lb

## 2014-10-11 DIAGNOSIS — I889 Nonspecific lymphadenitis, unspecified: Secondary | ICD-10-CM | POA: Diagnosis not present

## 2014-10-11 MED ORDER — FLUCONAZOLE 150 MG PO TABS: 150.0000 mg | ORAL_TABLET | Freq: Every day | ORAL | Status: DC

## 2014-10-11 MED ORDER — AZITHROMYCIN 250 MG PO TABS: ORAL_TABLET | ORAL | Status: DC

## 2014-10-11 NOTE — Progress Notes (Signed)
   Subjective:    Patient ID: Robin Gardner, female    DOB: 12/14/1965, 49 y.o.   MRN: [id number]  Otalgia  There is pain in the left ear. The current episode started in the past 7 days. Associated symptoms include headaches. Associated symptoms comments: Swollen glands. She has tried acetaminophen for the symptoms.   Ear painful left side, diminished energy  Neck painful and also the throat area  No sig fever  Dim energy      Review of Systems  HENT: Positive for ear pain.   Neurological: Positive for headaches.       Objective:   Physical Exam  Alert moderate malaise HEENT left TM slightly retracted tender cervical lymph node left side. Pharynx slight erythema neck supple. Lungs clear heart regular in rhythm      Assessment & Plan:  Impression pharyngitis with lymphadenitis plan anti-bites prescribed. Symptom care discussed. WSL

## 2014-10-18 ENCOUNTER — Other Ambulatory Visit: Payer: Self-pay | Admitting: Family Medicine

## 2014-11-09 ENCOUNTER — Other Ambulatory Visit: Payer: Self-pay | Admitting: Family Medicine

## 2014-11-10 ENCOUNTER — Encounter (HOSPITAL_COMMUNITY)
Admission: RE | Admit: 2014-11-10 | Discharge: 2014-11-10 | Disposition: A | Discharge status: Home / Self Care | Payer: BLUE CROSS/BLUE SHIELD | Source: Ambulatory Visit | Attending: Gastroenterology | Admitting: Gastroenterology

## 2014-11-10 DIAGNOSIS — K5190 Ulcerative colitis, unspecified, without complications: Secondary | ICD-10-CM | POA: Diagnosis not present

## 2014-11-10 MED ORDER — ACETAMINOPHEN 325 MG PO TABS
ORAL_TABLET | ORAL | Status: AC
  Filled 2014-11-10: qty 2

## 2014-11-10 MED ORDER — FAMOTIDINE 20 MG PO TABS
20.0000 mg | ORAL_TABLET | ORAL | Status: AC
  Administered 2014-11-10: 20 mg via ORAL
  Filled 2014-11-10: qty 1

## 2014-11-10 MED ORDER — SODIUM CHLORIDE 0.9 % IV SOLN: INTRAVENOUS | Status: DC

## 2014-11-10 MED ORDER — SODIUM CHLORIDE 0.9 % IV SOLN
8.0000 mg/kg | INTRAVENOUS | Status: DC
  Administered 2014-11-10: 700 mg via INTRAVENOUS
  Filled 2014-11-10: qty 70

## 2014-11-10 MED ORDER — ACETAMINOPHEN 325 MG PO TABS
650.0000 mg | ORAL_TABLET | ORAL | Status: DC
  Administered 2014-11-10: 650 mg via ORAL

## 2014-11-15 ENCOUNTER — Encounter: Payer: Self-pay | Admitting: Family Medicine

## 2014-11-15 ENCOUNTER — Ambulatory Visit (INDEPENDENT_AMBULATORY_CARE_PROVIDER_SITE_OTHER): Payer: BLUE CROSS/BLUE SHIELD | Admitting: Family Medicine

## 2014-11-15 VITALS — BP 132/84 | Temp 98.5°F | Ht 68.0 in | Wt 179.5 lb

## 2014-11-15 DIAGNOSIS — R0683 Snoring: Secondary | ICD-10-CM | POA: Diagnosis not present

## 2014-11-15 DIAGNOSIS — K51919 Ulcerative colitis, unspecified with unspecified complications: Secondary | ICD-10-CM | POA: Diagnosis not present

## 2014-11-15 DIAGNOSIS — G4730 Sleep apnea, unspecified: Secondary | ICD-10-CM | POA: Diagnosis not present

## 2014-11-15 DIAGNOSIS — G4710 Hypersomnia, unspecified: Secondary | ICD-10-CM

## 2014-11-15 MED ORDER — CIPROFLOXACIN HCL 500 MG PO TABS: ORAL_TABLET | ORAL | Status: DC

## 2014-11-15 MED ORDER — PREDNISONE 10 MG PO TABS: ORAL_TABLET | ORAL | Status: DC

## 2014-11-15 NOTE — Progress Notes (Signed)
   Subjective:    Patient ID: Robin Gardner, female    DOB: 12-07-1965, 49 y.o.   MRN: [id number] Patient arrives office with 2 distinct complicated concerns.   Abdominal Pain This is a new problem. The current episode started in the past 7 days. The onset quality is gradual. The problem occurs constantly. The problem has been unchanged. The pain is located in the LLQ. The pain is mild. The quality of the pain is dull. Associated symptoms include headaches and nausea. Nothing aggravates the pain. The pain is relieved by nothing. She has tried nothing for the symptoms. The treatment provided no relief.   Patient would also like discuss snoring. Frequent snoring, about all the timedodes not like to snore,  Sig daytinme snoreness. Patient states no other concerns this visit. A lot of daytime fatigue with this.  No major change in urinary or bowel habits. No blood in stool.  Definite pain and discomfort when leaning on left side. Next  Known history of ulcerative colitis llq pain achey, and dull i nature  Notes it constantly  Wondered about pulling a muscle, worse when laying on that side  . No known injury. Picking up a thirty punder regulalry off and on    Ms good  No blood no diarhea,     Review of Systems  Gastrointestinal: Positive for nausea and abdominal pain.  Neurological: Positive for headaches.       Objective:   Physical Exam  Alert vitals stable no acute distress pharynx soft palate redundant. Lungs clear. Heart rare rhythm. Abdomen bowel sounds present distinct left lower quadrant tenderness diffuse no rebound no guarding      Assessment & Plan:  Impression 1 probable flare of ulcerative colitis discussed #2 probable sleep apnea discussed plan prednisone taper. Cipro twice a day 10 days. Encouraged to get back with GI specialist. Sleep study. Further recommendations based results WSL

## 2014-11-23 ENCOUNTER — Other Ambulatory Visit: Payer: Self-pay | Admitting: Gastroenterology

## 2014-11-29 ENCOUNTER — Other Ambulatory Visit (HOSPITAL_COMMUNITY): Payer: Self-pay | Admitting: Respiratory Therapy

## 2014-11-29 DIAGNOSIS — R5383 Other fatigue: Secondary | ICD-10-CM

## 2014-11-29 DIAGNOSIS — R0683 Snoring: Secondary | ICD-10-CM

## 2014-11-29 DIAGNOSIS — G4730 Sleep apnea, unspecified: Secondary | ICD-10-CM

## 2014-12-03 ENCOUNTER — Other Ambulatory Visit (HOSPITAL_COMMUNITY): Payer: Self-pay | Admitting: Respiratory Therapy

## 2014-12-24 ENCOUNTER — Other Ambulatory Visit (HOSPITAL_COMMUNITY): Payer: Self-pay | Admitting: Respiratory Therapy

## 2015-01-05 ENCOUNTER — Encounter (HOSPITAL_COMMUNITY): Payer: Self-pay

## 2015-01-06 ENCOUNTER — Ambulatory Visit: Payer: BLUE CROSS/BLUE SHIELD | Admitting: Family Medicine

## 2015-01-28 ENCOUNTER — Other Ambulatory Visit: Payer: Self-pay | Admitting: Family Medicine

## 2015-02-01 ENCOUNTER — Ambulatory Visit (INDEPENDENT_AMBULATORY_CARE_PROVIDER_SITE_OTHER): Payer: BLUE CROSS/BLUE SHIELD | Admitting: Family Medicine

## 2015-02-01 ENCOUNTER — Encounter: Payer: Self-pay | Admitting: Family Medicine

## 2015-02-01 VITALS — BP 112/80 | Ht 68.0 in | Wt 182.1 lb

## 2015-02-01 DIAGNOSIS — F329 Major depressive disorder, single episode, unspecified: Secondary | ICD-10-CM

## 2015-02-01 DIAGNOSIS — E039 Hypothyroidism, unspecified: Secondary | ICD-10-CM

## 2015-02-01 DIAGNOSIS — K219 Gastro-esophageal reflux disease without esophagitis: Secondary | ICD-10-CM | POA: Diagnosis not present

## 2015-02-01 DIAGNOSIS — I889 Nonspecific lymphadenitis, unspecified: Secondary | ICD-10-CM

## 2015-02-01 DIAGNOSIS — I10 Essential (primary) hypertension: Secondary | ICD-10-CM

## 2015-02-01 DIAGNOSIS — F32A Depression, unspecified: Secondary | ICD-10-CM

## 2015-02-01 MED ORDER — ALPRAZOLAM 0.5 MG PO TABS: ORAL_TABLET | ORAL | Status: DC

## 2015-02-01 MED ORDER — LEVOTHYROXINE SODIUM 75 MCG PO TABS: 75.0000 ug | ORAL_TABLET | Freq: Every day | ORAL | Status: DC

## 2015-02-01 MED ORDER — SERTRALINE HCL 100 MG PO TABS: ORAL_TABLET | ORAL | Status: DC

## 2015-02-01 MED ORDER — AMLODIPINE BESYLATE 2.5 MG PO TABS: 2.5000 mg | ORAL_TABLET | Freq: Every day | ORAL | Status: DC

## 2015-02-01 MED ORDER — PENICILLIN V POTASSIUM 500 MG PO TABS: 500.0000 mg | ORAL_TABLET | Freq: Three times a day (TID) | ORAL | Status: DC

## 2015-02-01 MED ORDER — PANTOPRAZOLE SODIUM 40 MG PO TBEC: 40.0000 mg | DELAYED_RELEASE_TABLET | Freq: Two times a day (BID) | ORAL | Status: DC

## 2015-02-01 NOTE — Progress Notes (Signed)
   Subjective:    Patient ID: Robin Gardner, female    DOB: Aug 06, 1965, 49 y.o.   MRN: [id number]  Hypertension This is a chronic problem. The current episode started more than 1 year ago. The problem has been gradually improving since onset. There are no associated agents to hypertension. There are no known risk factors for coronary artery disease. Treatments tried: amlodipine. The current treatment provides moderate improvement. There are no compliance problems.    Patient states that the gland on the left side of her neck is swollen. Patient states that it is slightly improving now. Left ant neck pain and tenderess, hurts bad at times had a szensitive tooth, over the past couple wks  Exercise  Is so so  ewatching salt intake  Slacked off the last few wks, went without remicade and was hurting  Stomach overall in pretty good control as far as colitis  Ran out of thyr for a coupel days. States overall controls her symptoms well. No symptoms of high or low thyroid.  Claims depressions clinically stable. States deftly needs medication. No obvious side effects. Next  Trying to exercise more these days.    Review of Systems No headache no chest pain no back pain abdominal pain no change in bowel habits alert    Objective:   Physical Exam Alert vital stable blood pressure good on repeat HEENT left tender anterior glands at angle left jaw slight posterior come tenderness raise normal neck supple TMs good lungs clear. Heart regular in rhythm. Abdomen benign.       Assessment & Plan:  Impression 1 hypertension good control #2 lymphadenitis potentially odontogenic discussed #3 hypothyroidism clinically stable #4 depression stable #5 reflux improved plan maintain same medications. Add antibiotics. Symptom care discussed. Follow-up as scheduled. WSL

## 2015-03-10 ENCOUNTER — Ambulatory Visit (INDEPENDENT_AMBULATORY_CARE_PROVIDER_SITE_OTHER): Payer: BLUE CROSS/BLUE SHIELD | Admitting: Otolaryngology

## 2015-03-10 DIAGNOSIS — H903 Sensorineural hearing loss, bilateral: Secondary | ICD-10-CM | POA: Diagnosis not present

## 2015-03-10 DIAGNOSIS — H9313 Tinnitus, bilateral: Secondary | ICD-10-CM

## 2015-03-16 ENCOUNTER — Ambulatory Visit (INDEPENDENT_AMBULATORY_CARE_PROVIDER_SITE_OTHER): Payer: BLUE CROSS/BLUE SHIELD

## 2015-03-16 DIAGNOSIS — Z23 Encounter for immunization: Secondary | ICD-10-CM | POA: Diagnosis not present

## 2015-08-01 ENCOUNTER — Ambulatory Visit (INDEPENDENT_AMBULATORY_CARE_PROVIDER_SITE_OTHER): Payer: BLUE CROSS/BLUE SHIELD | Admitting: Family Medicine

## 2015-08-01 ENCOUNTER — Encounter: Payer: Self-pay | Admitting: Family Medicine

## 2015-08-01 VITALS — BP 122/72 | Ht 68.0 in | Wt 181.5 lb

## 2015-08-01 DIAGNOSIS — Z79899 Other long term (current) drug therapy: Secondary | ICD-10-CM

## 2015-08-01 DIAGNOSIS — E039 Hypothyroidism, unspecified: Secondary | ICD-10-CM | POA: Diagnosis not present

## 2015-08-01 DIAGNOSIS — F329 Major depressive disorder, single episode, unspecified: Secondary | ICD-10-CM

## 2015-08-01 DIAGNOSIS — Z139 Encounter for screening, unspecified: Secondary | ICD-10-CM

## 2015-08-01 DIAGNOSIS — I10 Essential (primary) hypertension: Secondary | ICD-10-CM | POA: Diagnosis not present

## 2015-08-01 DIAGNOSIS — F32A Depression, unspecified: Secondary | ICD-10-CM

## 2015-08-01 MED ORDER — AMLODIPINE BESYLATE 2.5 MG PO TABS: 2.5000 mg | ORAL_TABLET | Freq: Every day | ORAL | Status: DC

## 2015-08-01 MED ORDER — TRAMADOL HCL 50 MG PO TABS: ORAL_TABLET | ORAL | Status: DC

## 2015-08-01 MED ORDER — LEVOTHYROXINE SODIUM 75 MCG PO TABS: 75.0000 ug | ORAL_TABLET | Freq: Every day | ORAL | Status: DC

## 2015-08-01 MED ORDER — ALPRAZOLAM 0.5 MG PO TABS: ORAL_TABLET | ORAL | Status: DC

## 2015-08-01 MED ORDER — PANTOPRAZOLE SODIUM 40 MG PO TBEC: 40.0000 mg | DELAYED_RELEASE_TABLET | Freq: Two times a day (BID) | ORAL | Status: DC

## 2015-08-01 MED ORDER — SERTRALINE HCL 100 MG PO TABS: ORAL_TABLET | ORAL | Status: DC

## 2015-08-01 MED ORDER — FLUTICASONE PROPIONATE 50 MCG/ACT NA SUSP: 2.0000 | Freq: Every day | NASAL | Status: DC

## 2015-08-01 NOTE — Progress Notes (Signed)
   Subjective:    Patient ID: Robin Gardner, female    DOB: 1965-12-13, 50 y.o.   MRN: [id number]  Hypertension This is a chronic problem. The current episode started more than 1 year ago. There are no compliance problems.    patient states compliant with blood pressure medication. No obvious side effects.  Compliant with her thyroid medicine. No symptoms of high or low thyroid other than chronic fatigue.  Chronic challenges with insomnia. Had a prednisone deftly helps states needs it meds reviewed today.  Ongoing reflux with definitely needs for acid blocker  Ongoing depression. States Zoloft is helping things under control no obvious side effects remicad upped by the rheumatologist  waitng now to see how things are goingPatient states no other concerns this visit.  Now using a pain cream to the back, tyl not helping the low bk pain, and cant take NSaid's  On remicad for both the u c and the arthritis assoc with  Pt gets out and walks regulaly  Notes not good energy, wiped out and tired,   Claims compliant with bp meds.   Review of Systems No headache, no major weight loss or weight gain, no chest pain no back pain abdominal pain no change in bowel habits complete ROS otherwise negative     Objective:   Physical Exam Alert no apparent distress H&T normal blood pressure good on repeat thyroid nonpalpable lungs clear. Heart rare rhythm ankles without edema       Assessment & Plan:  Impression 1 hypertension good control reviewed maintain same #2 hypothyroidism status uncertain O blood work reviewed. #3 depression good control stable #4 insomnia element of anxiety overall stable #5 fatigue ongoing likely due to rheumatoid arthritis along with therapies though cannot rule out other etiologies plan appropriate blood work diet exercise discussed medications refilled recheck in 6 months WSL

## 2015-08-02 LAB — LIPID PANEL
CHOLESTEROL TOTAL: 236 mg/dL — AB (ref 100–199)
Chol/HDL Ratio: 5.6 ratio units — ABNORMAL HIGH (ref 0.0–4.4)
HDL: 42 mg/dL (ref >39)
LDL Calculated: 161 mg/dL — ABNORMAL HIGH (ref 0–99)
Triglycerides: 163 mg/dL — ABNORMAL HIGH (ref 0–149)
VLDL Cholesterol Cal: 33 mg/dL (ref 5–40)

## 2015-08-02 LAB — BASIC METABOLIC PANEL
BUN/Creatinine Ratio: 15 (ref 9–23)
BUN: 12 mg/dL (ref 6–24)
CALCIUM: 9.2 mg/dL (ref 8.7–10.2)
CHLORIDE: 100 mmol/L (ref 96–106)
CO2: 24 mmol/L (ref 18–29)
Creatinine, Ser: 0.79 mg/dL (ref 0.57–1.00)
GFR calc Af Amer: 101 mL/min/{1.73_m2} (ref >59)
GFR, EST NON AFRICAN AMERICAN: 88 mL/min/{1.73_m2} (ref >59)
Glucose: 95 mg/dL (ref 65–99)
Potassium: 4.3 mmol/L (ref 3.5–5.2)
SODIUM: 138 mmol/L (ref 134–144)

## 2015-08-02 LAB — HEPATIC FUNCTION PANEL
ALBUMIN: 4.6 g/dL (ref 3.5–5.5)
ALT: 20 IU/L (ref 0–32)
AST: 25 IU/L (ref 0–40)
Alkaline Phosphatase: 71 IU/L (ref 39–117)
BILIRUBIN TOTAL: 0.4 mg/dL (ref 0.0–1.2)
Bilirubin, Direct: 0.08 mg/dL (ref 0.00–0.40)
Total Protein: 7.5 g/dL (ref 6.0–8.5)

## 2015-08-02 LAB — TSH: TSH: 3.6 u[IU]/mL (ref 0.450–4.500)

## 2015-08-02 LAB — T4: T4, Total: 10 ug/dL (ref 4.5–12.0)

## 2015-08-07 ENCOUNTER — Encounter: Payer: Self-pay | Admitting: Family Medicine

## 2015-09-07 DIAGNOSIS — K5190 Ulcerative colitis, unspecified, without complications: Secondary | ICD-10-CM | POA: Diagnosis not present

## 2015-09-26 DIAGNOSIS — M4690 Unspecified inflammatory spondylopathy, site unspecified: Secondary | ICD-10-CM | POA: Diagnosis not present

## 2015-09-26 DIAGNOSIS — K5190 Ulcerative colitis, unspecified, without complications: Secondary | ICD-10-CM | POA: Diagnosis not present

## 2015-09-26 DIAGNOSIS — Z79899 Other long term (current) drug therapy: Secondary | ICD-10-CM | POA: Diagnosis not present

## 2015-09-26 DIAGNOSIS — M533 Sacrococcygeal disorders, not elsewhere classified: Secondary | ICD-10-CM | POA: Diagnosis not present

## 2015-10-11 DIAGNOSIS — M545 Low back pain: Secondary | ICD-10-CM | POA: Diagnosis not present

## 2015-10-14 DIAGNOSIS — M545 Low back pain: Secondary | ICD-10-CM | POA: Diagnosis not present

## 2015-10-17 DIAGNOSIS — M545 Low back pain: Secondary | ICD-10-CM | POA: Diagnosis not present

## 2015-10-18 ENCOUNTER — Telehealth: Payer: Self-pay | Admitting: Family Medicine

## 2015-10-18 NOTE — Telephone Encounter (Signed)
Pt is needing an updated letter regarding her current condition for her disability. Pt has a hearing scheduled the middle to end of July.

## 2015-10-19 DIAGNOSIS — K5190 Ulcerative colitis, unspecified, without complications: Secondary | ICD-10-CM | POA: Diagnosis not present

## 2015-10-26 NOTE — Telephone Encounter (Signed)
Call pt, how long ago since last worked? What about symptoms are most aggravating to the pt and keep her from working? Tell her we'll do letter and ready by mid next week

## 2015-10-27 NOTE — Telephone Encounter (Signed)
Patient called back and stated that she has frequent trips to the bathroom daily. Patient has a history of ulcerative colitis. She has to use the bathroom an average of 5-6 times daily (on a good day).

## 2015-10-27 NOTE — Telephone Encounter (Signed)
Patient states that it will be 3 years in December since the last time she has worked. Patient states that she has pain in both hips, lower back, both ankles, both legs and she has bilateral hand pain and this keeps her from being able to work. Patient states that she can't sit or stand for long periods of time without experiencing pain.

## 2015-11-01 DIAGNOSIS — M545 Low back pain: Secondary | ICD-10-CM | POA: Diagnosis not present

## 2015-11-03 DIAGNOSIS — M545 Low back pain: Secondary | ICD-10-CM | POA: Diagnosis not present

## 2015-11-04 ENCOUNTER — Encounter: Payer: Self-pay | Admitting: Family Medicine

## 2015-11-04 NOTE — Telephone Encounter (Signed)
Patient is coming to pick up letter this afternoon.

## 2015-11-04 NOTE — Telephone Encounter (Signed)
[facility] 11/04/15

## 2015-11-04 NOTE — Telephone Encounter (Signed)
Done sent to [facility]

## 2015-11-11 ENCOUNTER — Ambulatory Visit (INDEPENDENT_AMBULATORY_CARE_PROVIDER_SITE_OTHER): Payer: BLUE CROSS/BLUE SHIELD | Admitting: Family Medicine

## 2015-11-11 ENCOUNTER — Encounter: Payer: Self-pay | Admitting: Family Medicine

## 2015-11-11 VITALS — BP 134/84 | Temp 98.0°F | Ht 68.0 in | Wt 187.0 lb

## 2015-11-11 DIAGNOSIS — R309 Painful micturition, unspecified: Secondary | ICD-10-CM

## 2015-11-11 LAB — POCT URINALYSIS DIPSTICK
Spec Grav, UA: <=1.005
pH, UA: 7

## 2015-11-11 MED ORDER — CIPROFLOXACIN HCL 250 MG PO TABS: 250.0000 mg | ORAL_TABLET | Freq: Two times a day (BID) | ORAL | Status: DC

## 2015-11-11 NOTE — Progress Notes (Signed)
   Subjective:    Patient ID: Robin Gardner, female    DOB: [birth year], 50 y.o.   MRN: [id number]  Urinary Tract Infection  This is a new problem. The current episode started today. The problem occurs every urination. Quality: Pressure  Associated symptoms include frequency. Associated symptoms comments: Painful urination. She has tried nothing for the symptoms.    Patient has concerns of "uncontrollable shaking" to bilateral hands.comes and goes. Notes shakiness in the hands.  No hx of reg temor in the familyu  Pos parkinsons in gmo on fa side    Results for orders placed or performed in visit on 11/11/15  POCT urinalysis dipstick  Result Value Ref Range   Color, UA Yellow    Clarity, UA Clear    Glucose, UA     Bilirubin, UA     Ketones, UA     Spec Grav, UA <=1.005    Blood, UA Large    pH, UA 7.0    Protein, UA     Urobilinogen, UA     Nitrite, UA     Leukocytes, UA small (1+) (A) Negative   Pos freq urination and dysuria  Some discomfort  Some dysuria nocturia last night     Review of Systems  Genitourinary: Positive for frequency.    no fever no chlls no vomitng    Objective:   Physical Exam   ale slgt malaise lungs clear heart regular n rhythm no cA teernes neuroloica exam essential temor evident.      Assessment & Plan:  impesion  rinary tract infectin 24 whites per high-powered fied #2 essental tremor discuss pla antibiotics prescribed local meaues discussed WSL

## 2015-11-15 DIAGNOSIS — L820 Inflamed seborrheic keratosis: Secondary | ICD-10-CM | POA: Diagnosis not present

## 2015-11-21 DIAGNOSIS — H2513 Age-related nuclear cataract, bilateral: Secondary | ICD-10-CM | POA: Diagnosis not present

## 2015-11-22 DIAGNOSIS — H2512 Age-related nuclear cataract, left eye: Secondary | ICD-10-CM | POA: Diagnosis not present

## 2015-11-22 DIAGNOSIS — H5362 Acquired night blindness: Secondary | ICD-10-CM | POA: Diagnosis not present

## 2015-11-22 DIAGNOSIS — H2511 Age-related nuclear cataract, right eye: Secondary | ICD-10-CM | POA: Diagnosis not present

## 2015-11-22 DIAGNOSIS — E505 Vitamin A deficiency with night blindness: Secondary | ICD-10-CM | POA: Diagnosis not present

## 2015-11-25 DIAGNOSIS — H5340 Unspecified visual field defects: Secondary | ICD-10-CM | POA: Diagnosis not present

## 2015-11-30 DIAGNOSIS — K5190 Ulcerative colitis, unspecified, without complications: Secondary | ICD-10-CM | POA: Diagnosis not present

## 2015-12-01 ENCOUNTER — Ambulatory Visit (INDEPENDENT_AMBULATORY_CARE_PROVIDER_SITE_OTHER): Payer: BLUE CROSS/BLUE SHIELD | Admitting: Family Medicine

## 2015-12-01 VITALS — Ht 68.0 in | Wt 188.8 lb

## 2015-12-01 DIAGNOSIS — M79671 Pain in right foot: Secondary | ICD-10-CM | POA: Diagnosis not present

## 2015-12-01 NOTE — Progress Notes (Signed)
   Subjective:    Patient ID: Robin Gardner, female    DOB: 1966/05/01, 50 y.o.   MRN: [id number]  HPI Patient arrives with c/o right little toe pain for a while. Patient thinks it may be a callus.She denies any high fever chills sweats denies any redness or drainage from the foot denies stepping on anything. Occasionally gets some low back pain but no true sciatica.   Review of Systems See above    Objective:   Physical Exam Negative straight leg raise tight hamstrings no plantar wart noted. No infection noted. Large callus noted.  Patient had previous bunion surgery from Dr. Caprice Beaver     Assessment & Plan:  Mild foot pain related to his severe callus on the outer aspect of the fifth metatarsal I believe the patient will need to see podiatry have the callus trimmed down also have her shoes inspected for the possibility of the insert patient maybe altering how she puts pressure on her foot in relation to some back pain  I believe the callus is putting pressure on the nerve which is causing some burning  Patient is already an established patient with Dr. Caprice Beaver he is done operations on her feet twice she states she can call and get an appointment. We will send a copy of the office note to his office

## 2015-12-06 DIAGNOSIS — M79671 Pain in right foot: Secondary | ICD-10-CM | POA: Diagnosis not present

## 2015-12-06 DIAGNOSIS — L851 Acquired keratosis [keratoderma] palmaris et plantaris: Secondary | ICD-10-CM | POA: Diagnosis not present

## 2015-12-06 DIAGNOSIS — M7741 Metatarsalgia, right foot: Secondary | ICD-10-CM | POA: Diagnosis not present

## 2015-12-12 DIAGNOSIS — H5340 Unspecified visual field defects: Secondary | ICD-10-CM | POA: Diagnosis not present

## 2015-12-12 DIAGNOSIS — E505 Vitamin A deficiency with night blindness: Secondary | ICD-10-CM | POA: Diagnosis not present

## 2015-12-12 DIAGNOSIS — K51919 Ulcerative colitis, unspecified with unspecified complications: Secondary | ICD-10-CM | POA: Diagnosis not present

## 2015-12-12 DIAGNOSIS — M0760 Enteropathic arthropathies, unspecified site: Secondary | ICD-10-CM | POA: Diagnosis not present

## 2016-01-11 DIAGNOSIS — K5190 Ulcerative colitis, unspecified, without complications: Secondary | ICD-10-CM | POA: Diagnosis not present

## 2016-01-12 DIAGNOSIS — E505 Vitamin A deficiency with night blindness: Secondary | ICD-10-CM | POA: Diagnosis not present

## 2016-01-12 DIAGNOSIS — H5362 Acquired night blindness: Secondary | ICD-10-CM | POA: Diagnosis not present

## 2016-01-12 DIAGNOSIS — H2511 Age-related nuclear cataract, right eye: Secondary | ICD-10-CM | POA: Diagnosis not present

## 2016-01-12 DIAGNOSIS — H5340 Unspecified visual field defects: Secondary | ICD-10-CM | POA: Diagnosis not present

## 2016-02-01 ENCOUNTER — Encounter: Payer: Self-pay | Admitting: Family Medicine

## 2016-02-01 ENCOUNTER — Ambulatory Visit (INDEPENDENT_AMBULATORY_CARE_PROVIDER_SITE_OTHER): Payer: BLUE CROSS/BLUE SHIELD | Admitting: Family Medicine

## 2016-02-01 VITALS — BP 128/82 | Ht 68.0 in | Wt 189.2 lb

## 2016-02-01 DIAGNOSIS — Z79899 Other long term (current) drug therapy: Secondary | ICD-10-CM | POA: Diagnosis not present

## 2016-02-01 DIAGNOSIS — F329 Major depressive disorder, single episode, unspecified: Secondary | ICD-10-CM

## 2016-02-01 DIAGNOSIS — E039 Hypothyroidism, unspecified: Secondary | ICD-10-CM | POA: Diagnosis not present

## 2016-02-01 DIAGNOSIS — K219 Gastro-esophageal reflux disease without esophagitis: Secondary | ICD-10-CM | POA: Diagnosis not present

## 2016-02-01 DIAGNOSIS — Z23 Encounter for immunization: Secondary | ICD-10-CM | POA: Diagnosis not present

## 2016-02-01 DIAGNOSIS — I10 Essential (primary) hypertension: Secondary | ICD-10-CM

## 2016-02-01 DIAGNOSIS — F32A Depression, unspecified: Secondary | ICD-10-CM

## 2016-02-01 MED ORDER — SERTRALINE HCL 100 MG PO TABS: ORAL_TABLET | ORAL | 5 refills | Status: DC

## 2016-02-01 MED ORDER — PANTOPRAZOLE SODIUM 40 MG PO TBEC: 40.0000 mg | DELAYED_RELEASE_TABLET | Freq: Two times a day (BID) | ORAL | 5 refills | Status: DC

## 2016-02-01 MED ORDER — AMLODIPINE BESYLATE 2.5 MG PO TABS: 2.5000 mg | ORAL_TABLET | Freq: Every day | ORAL | 5 refills | Status: DC

## 2016-02-01 MED ORDER — LEVOTHYROXINE SODIUM 75 MCG PO TABS: 75.0000 ug | ORAL_TABLET | Freq: Every day | ORAL | 5 refills | Status: DC

## 2016-02-01 NOTE — Progress Notes (Signed)
   Subjective:    Patient ID: Robin Gardner, female    DOB: 1966/02/07, 50 y.o.   MRN: [id number] Patient presents with numerous concerns. Hypertension  This is a chronic problem. The current episode started more than 1 year ago. The problem has been gradually improving since onset. There are no associated agents to hypertension. There are no known risk factors for coronary artery disease. Treatments tried: amlodipine. The current treatment provides moderate improvement. There are no compliance problems.    Patient needs liver enzymes checked per her specialist. Now on hi dose vit A high dose, retin spec rec Vit A hig dose  Toes slightly puffy towards the evening . Goes back down during the day.  Neck and shoulder right ain.Marland Kitchentries to stay active stretcig right shoulder more painful than left. Patient is right arm. Recalls no sudden injury. Next  Sees a rheumatologist and a GI specialist for her ulcerative colitis and extra gastrin test normal features such as arthralgias fatigue arthritis etc.  Ongoing challenges with depression clinically stable current meds no obvious side effects  Walking some  Combination of re not exerc, mostly mental   On thyroid medicine. Recent weight gain. Ongoing fatigue which is difficult to sort out  Patient states that her toes swell and are painful sometimes. Onset several months ago.   Patient has neck pain and shoulder pain also.     Review of Systems No headache, no major weight loss or weight gain, no chest pain no back pain abdominal pain no change in bowel habits complete ROS otherwise negative     Objective:   Physical Exam  Alert vitals stable, NAD. Blood pressure good on repeat. HEENT normal. Lungs clear. Heart regular rate and rhythm. Trace edema distal feet pulses good. Sensation intact. Shoulder positive impingement sign neck supple slight tenderness      Assessment & Plan:  Impression 1 hypothyroidism status uncertain discuss  check blood work #2 hyperlipidemia LDL patient work on a will see where it is this time #3 liver enzyme screening per specialist #4 multiple joint pains multifactorial discussed #5 hypertension decent control discussed maintain same #6 slight venous stasis not enough for medication discussed plan diet exercise discussed appropriate blood work flu shot. Check every 6 months WSL

## 2016-02-02 DIAGNOSIS — Z79899 Other long term (current) drug therapy: Secondary | ICD-10-CM | POA: Diagnosis not present

## 2016-02-02 DIAGNOSIS — M4690 Unspecified inflammatory spondylopathy, site unspecified: Secondary | ICD-10-CM | POA: Diagnosis not present

## 2016-02-02 DIAGNOSIS — M533 Sacrococcygeal disorders, not elsewhere classified: Secondary | ICD-10-CM | POA: Diagnosis not present

## 2016-02-02 DIAGNOSIS — K5190 Ulcerative colitis, unspecified, without complications: Secondary | ICD-10-CM | POA: Diagnosis not present

## 2016-02-02 LAB — LIPID PANEL
Chol/HDL Ratio: 5.7 ratio units — ABNORMAL HIGH (ref 0.0–4.4)
Cholesterol, Total: 218 mg/dL — ABNORMAL HIGH (ref 100–199)
HDL: 38 mg/dL — ABNORMAL LOW (ref >39)
LDL Calculated: 138 mg/dL — ABNORMAL HIGH (ref 0–99)
Triglycerides: 209 mg/dL — ABNORMAL HIGH (ref 0–149)
VLDL Cholesterol Cal: 42 mg/dL — ABNORMAL HIGH (ref 5–40)

## 2016-02-02 LAB — BASIC METABOLIC PANEL
BUN / CREAT RATIO: 16 (ref 9–23)
BUN: 12 mg/dL (ref 6–24)
CO2: 25 mmol/L (ref 18–29)
Calcium: 9.1 mg/dL (ref 8.7–10.2)
Chloride: 100 mmol/L (ref 96–106)
Creatinine, Ser: 0.74 mg/dL (ref 0.57–1.00)
GFR calc Af Amer: 108 mL/min/{1.73_m2} (ref >59)
GFR calc non Af Amer: 94 mL/min/{1.73_m2} (ref >59)
Glucose: 88 mg/dL (ref 65–99)
Potassium: 4 mmol/L (ref 3.5–5.2)
Sodium: 142 mmol/L (ref 134–144)

## 2016-02-02 LAB — HEPATIC FUNCTION PANEL
ALT: 22 IU/L (ref 0–32)
AST: 24 IU/L (ref 0–40)
Albumin: 4.7 g/dL (ref 3.5–5.5)
Alkaline Phosphatase: 77 IU/L (ref 39–117)
BILIRUBIN TOTAL: 0.3 mg/dL (ref 0.0–1.2)
BILIRUBIN, DIRECT: 0.07 mg/dL (ref 0.00–0.40)
Total Protein: 7.6 g/dL (ref 6.0–8.5)

## 2016-02-02 LAB — TSH: TSH: 3.32 u[IU]/mL (ref 0.450–4.500)

## 2016-02-06 ENCOUNTER — Encounter: Payer: Self-pay | Admitting: Family Medicine

## 2016-02-21 DIAGNOSIS — K5190 Ulcerative colitis, unspecified, without complications: Secondary | ICD-10-CM | POA: Diagnosis not present

## 2016-03-13 DIAGNOSIS — L851 Acquired keratosis [keratoderma] palmaris et plantaris: Secondary | ICD-10-CM | POA: Diagnosis not present

## 2016-03-13 DIAGNOSIS — M79671 Pain in right foot: Secondary | ICD-10-CM | POA: Diagnosis not present

## 2016-03-14 DIAGNOSIS — L820 Inflamed seborrheic keratosis: Secondary | ICD-10-CM | POA: Diagnosis not present

## 2016-03-14 DIAGNOSIS — L508 Other urticaria: Secondary | ICD-10-CM | POA: Diagnosis not present

## 2016-03-14 DIAGNOSIS — B078 Other viral warts: Secondary | ICD-10-CM | POA: Diagnosis not present

## 2016-03-15 ENCOUNTER — Ambulatory Visit (INDEPENDENT_AMBULATORY_CARE_PROVIDER_SITE_OTHER): Payer: BLUE CROSS/BLUE SHIELD | Admitting: Otolaryngology

## 2016-03-15 DIAGNOSIS — H9313 Tinnitus, bilateral: Secondary | ICD-10-CM

## 2016-03-15 DIAGNOSIS — H903 Sensorineural hearing loss, bilateral: Secondary | ICD-10-CM

## 2016-04-05 ENCOUNTER — Encounter: Payer: Self-pay | Admitting: Family Medicine

## 2016-04-05 ENCOUNTER — Ambulatory Visit (INDEPENDENT_AMBULATORY_CARE_PROVIDER_SITE_OTHER): Payer: BLUE CROSS/BLUE SHIELD | Admitting: Family Medicine

## 2016-04-05 VITALS — BP 130/80 | Temp 98.6°F | Ht 68.0 in | Wt 191.0 lb

## 2016-04-05 DIAGNOSIS — J329 Chronic sinusitis, unspecified: Secondary | ICD-10-CM

## 2016-04-05 MED ORDER — CEFDINIR 300 MG PO CAPS: 300.0000 mg | ORAL_CAPSULE | Freq: Two times a day (BID) | ORAL | 0 refills | Status: DC

## 2016-04-05 NOTE — Progress Notes (Signed)
   Subjective:    Patient ID: Robin Gardner, female    DOB: 16-Jun-1965, 50 y.o.   MRN: [id number]  Sinusitis  This is a new problem. The current episode started 1 to 4 weeks ago. The problem is unchanged. There has been no fever. The pain is moderate. Associated symptoms include congestion, coughing and ear pain. Treatments tried: Mucinex D. The treatment provided no relief.  Patient has no other concerns at this time.   Severe sinus cong and h a  Nose stopped up  Worse with postion   No fever. Needs to have better so cna get infuson  Patient on immune suppressant medication Review of Systems  HENT: Positive for congestion and ear pain.   Respiratory: Positive for cough.        Objective:   Physical Exam  Alert, mild malaise. Hydration good Vitals stable. frontal/ maxillary tenderness evident positive nasal congestion. pharynx normal neck supple  lungs clear/no crackles or wheezes. heart regular in rhythm       Assessment & Plan:  Impression rhinosinusitis likely post viral, discussed with patient. plan antibiotics prescribed. Questions answered. Symptomatic care discussed. warning signs discussed. WSL

## 2016-04-13 ENCOUNTER — Telehealth: Payer: Self-pay | Admitting: Family Medicine

## 2016-04-13 NOTE — Telephone Encounter (Signed)
It is important for the patient to realize that all antibiotics can flareup ulcerative colitis. Plan amoxicillin typically is gentler versus other antibiotics. This patient had ulcerative colitis issues with Augmentin which is amoxicillin with clavulanate there is no easy answers for this patient. She will need to decide what she would like to do either go with the current medication or switch over to plain amoxicillin or go without

## 2016-04-13 NOTE — Telephone Encounter (Signed)
Patient seen on 04/05/16 for rhinosinusitis by Dr. Richardson Landry.  She is still having a sore throat, and drainage.  She said the antibiotic she was given is making her sick to her stomach.  She said she is starting a probiotic today, but she is afraid that the antibiotic is going to cause a flare up of her ulcerative colitis.  Please advise.  [employer]

## 2016-04-13 NOTE — Telephone Encounter (Signed)
Pt wants to take whatever is gentler

## 2016-04-15 NOTE — Telephone Encounter (Signed)
Call pt plain amox  500 tid ten d, document augmentin in drug sensitivity

## 2016-04-16 ENCOUNTER — Other Ambulatory Visit: Payer: Self-pay | Admitting: *Deleted

## 2016-04-16 MED ORDER — AMOXICILLIN 500 MG PO CAPS: 500.0000 mg | ORAL_CAPSULE | Freq: Three times a day (TID) | ORAL | 0 refills | Status: DC

## 2016-04-16 NOTE — Telephone Encounter (Signed)
Med sent to pharm. Pt notified.  

## 2016-04-23 DIAGNOSIS — K5190 Ulcerative colitis, unspecified, without complications: Secondary | ICD-10-CM | POA: Diagnosis not present

## 2016-04-25 ENCOUNTER — Other Ambulatory Visit: Payer: Self-pay | Admitting: Family Medicine

## 2016-04-25 NOTE — Telephone Encounter (Signed)
Ok times one 

## 2016-05-28 ENCOUNTER — Ambulatory Visit (INDEPENDENT_AMBULATORY_CARE_PROVIDER_SITE_OTHER): Payer: BLUE CROSS/BLUE SHIELD | Admitting: Nurse Practitioner

## 2016-05-28 VITALS — BP 128/88 | Temp 98.7°F | Wt 190.8 lb

## 2016-05-28 DIAGNOSIS — J329 Chronic sinusitis, unspecified: Secondary | ICD-10-CM | POA: Diagnosis not present

## 2016-05-28 DIAGNOSIS — J310 Chronic rhinitis: Secondary | ICD-10-CM

## 2016-05-28 MED ORDER — FLUCONAZOLE 150 MG PO TABS: ORAL_TABLET | ORAL | 0 refills | Status: DC

## 2016-05-28 MED ORDER — AZITHROMYCIN 250 MG PO TABS: ORAL_TABLET | ORAL | 0 refills | Status: DC

## 2016-05-29 ENCOUNTER — Encounter: Payer: Self-pay | Admitting: Nurse Practitioner

## 2016-05-29 NOTE — Progress Notes (Signed)
Subjective:  Presents for c/o cough that began around Christmas. No fever. Scratchy throat. Frontal area headache. Clear runny nose. Cough mainly at nighttime and in the mornings. Mostly nonproductive. No ear pain. No wheezing. Currently on Protonix for her reflux which is stable.  Objective:   BP 128/88   Temp 98.7 F (37.1 C) (Oral)   Wt 190 lb 12.8 oz (86.5 kg)   BMI 29.01 kg/m  NAD. Alert, oriented. TMs clear effusion, no erythema. Pharynx injected with PND noted. Neck supple with mild soft anterior adenopathy. Lungs clear. Heart regular rate rhythm. Abdomen soft nontender.  Assessment:  Rhinosinusitis    Plan:  Meds ordered this encounter  Medications  . azithromycin (ZITHROMAX Z-PAK) 250 MG tablet    Sig: Take 2 tablets (500 mg) on  Day 1,  followed by 1 tablet (250 mg) once daily on Days 2 through 5.    Dispense:  6 each    Refill:  0    Order Specific Question:   Supervising Provider    Answer:   Mikey Kirschner [[id number]]  . fluconazole (DIFLUCAN) 150 MG tablet    Sig: One po qd prn yeast infection; may repeat in 3-4 days if needed    Dispense:  2 tablet    Refill:  0    Order Specific Question:   Supervising Provider    Answer:   Mikey Kirschner [[id number]]   OTC meds as directed for congestion and cough. Callback in 10-14 days if no improvement, sooner if worse.

## 2016-06-04 DIAGNOSIS — M79641 Pain in right hand: Secondary | ICD-10-CM | POA: Diagnosis not present

## 2016-06-04 DIAGNOSIS — Z79899 Other long term (current) drug therapy: Secondary | ICD-10-CM | POA: Diagnosis not present

## 2016-06-04 DIAGNOSIS — M79642 Pain in left hand: Secondary | ICD-10-CM | POA: Diagnosis not present

## 2016-06-04 DIAGNOSIS — M4690 Unspecified inflammatory spondylopathy, site unspecified: Secondary | ICD-10-CM | POA: Diagnosis not present

## 2016-06-04 DIAGNOSIS — M533 Sacrococcygeal disorders, not elsewhere classified: Secondary | ICD-10-CM | POA: Diagnosis not present

## 2016-06-04 DIAGNOSIS — K5190 Ulcerative colitis, unspecified, without complications: Secondary | ICD-10-CM | POA: Diagnosis not present

## 2016-06-12 DIAGNOSIS — Z6829 Body mass index (BMI) 29.0-29.9, adult: Secondary | ICD-10-CM | POA: Diagnosis not present

## 2016-06-12 DIAGNOSIS — Z01419 Encounter for gynecological examination (general) (routine) without abnormal findings: Secondary | ICD-10-CM | POA: Diagnosis not present

## 2016-06-12 DIAGNOSIS — Z1231 Encounter for screening mammogram for malignant neoplasm of breast: Secondary | ICD-10-CM | POA: Diagnosis not present

## 2016-06-19 DIAGNOSIS — K5190 Ulcerative colitis, unspecified, without complications: Secondary | ICD-10-CM | POA: Diagnosis not present

## 2016-06-19 DIAGNOSIS — M4690 Unspecified inflammatory spondylopathy, site unspecified: Secondary | ICD-10-CM | POA: Diagnosis not present

## 2016-06-19 DIAGNOSIS — M533 Sacrococcygeal disorders, not elsewhere classified: Secondary | ICD-10-CM | POA: Diagnosis not present

## 2016-06-19 DIAGNOSIS — Z79899 Other long term (current) drug therapy: Secondary | ICD-10-CM | POA: Diagnosis not present

## 2016-07-02 DIAGNOSIS — M533 Sacrococcygeal disorders, not elsewhere classified: Secondary | ICD-10-CM | POA: Diagnosis not present

## 2016-07-02 DIAGNOSIS — M4690 Unspecified inflammatory spondylopathy, site unspecified: Secondary | ICD-10-CM | POA: Diagnosis not present

## 2016-07-02 DIAGNOSIS — K5190 Ulcerative colitis, unspecified, without complications: Secondary | ICD-10-CM | POA: Diagnosis not present

## 2016-07-02 DIAGNOSIS — Z79899 Other long term (current) drug therapy: Secondary | ICD-10-CM | POA: Diagnosis not present

## 2016-07-24 DIAGNOSIS — K5190 Ulcerative colitis, unspecified, without complications: Secondary | ICD-10-CM | POA: Diagnosis not present

## 2016-07-30 ENCOUNTER — Other Ambulatory Visit: Payer: Self-pay | Admitting: Family Medicine

## 2016-07-30 ENCOUNTER — Other Ambulatory Visit: Payer: Self-pay | Admitting: Nurse Practitioner

## 2016-07-31 NOTE — Telephone Encounter (Signed)
Patient last seen for a medication check in September 2017. May we refill?

## 2016-07-31 NOTE — Telephone Encounter (Signed)
30 d only no refill, needs o v

## 2016-08-01 ENCOUNTER — Ambulatory Visit: Payer: BLUE CROSS/BLUE SHIELD | Admitting: Family Medicine

## 2016-08-14 ENCOUNTER — Ambulatory Visit: Payer: BLUE CROSS/BLUE SHIELD | Admitting: Family Medicine

## 2016-08-21 ENCOUNTER — Ambulatory Visit (INDEPENDENT_AMBULATORY_CARE_PROVIDER_SITE_OTHER): Payer: BLUE CROSS/BLUE SHIELD | Admitting: Family Medicine

## 2016-08-21 VITALS — BP 132/86 | Ht 68.0 in | Wt 191.0 lb

## 2016-08-21 DIAGNOSIS — F321 Major depressive disorder, single episode, moderate: Secondary | ICD-10-CM | POA: Diagnosis not present

## 2016-08-21 DIAGNOSIS — E039 Hypothyroidism, unspecified: Secondary | ICD-10-CM | POA: Diagnosis not present

## 2016-08-21 DIAGNOSIS — I10 Essential (primary) hypertension: Secondary | ICD-10-CM | POA: Diagnosis not present

## 2016-08-21 DIAGNOSIS — Z13220 Encounter for screening for lipoid disorders: Secondary | ICD-10-CM

## 2016-08-21 MED ORDER — PANTOPRAZOLE SODIUM 40 MG PO TBEC: 40.0000 mg | DELAYED_RELEASE_TABLET | Freq: Two times a day (BID) | ORAL | 5 refills | Status: DC

## 2016-08-21 MED ORDER — SERTRALINE HCL 100 MG PO TABS: 100.0000 mg | ORAL_TABLET | Freq: Every day | ORAL | 5 refills | Status: DC

## 2016-08-21 MED ORDER — LEVOTHYROXINE SODIUM 75 MCG PO TABS: 75.0000 ug | ORAL_TABLET | Freq: Every day | ORAL | 5 refills | Status: DC

## 2016-08-21 MED ORDER — HYDROCORTISONE 1 % EX LOTN: 1.0000 "application " | TOPICAL_LOTION | Freq: Two times a day (BID) | CUTANEOUS | 2 refills | Status: DC

## 2016-08-21 MED ORDER — AMLODIPINE BESYLATE 2.5 MG PO TABS: 2.5000 mg | ORAL_TABLET | Freq: Every day | ORAL | 5 refills | Status: DC

## 2016-08-21 NOTE — Progress Notes (Signed)
   Subjective:    Patient ID: Robin Gardner, female    DOB: [birth year], 51 y.o.   MRN: [id number] Patient arrives office with numerous concerns Hypertension  This is a chronic problem. The current episode started more than 1 year ago. Treatments tried: amlodipine. There are no compliance problems (eats healthy, doing more exercise).    Skin is extremely dry since starting methotrexate.   Feet burn after being up on them all day.   Hips have flared up with the chronic arthritis  Now on methotrexate pus folic  Skin drying out and scaly and worse than normal   Bad on the legs   uc overall stable, but the ibs still a daily challenge, colitis in remission  Doing walk of remembrance soon  Walking a bit in the yard  Blood pressure medicine and blood pressure levels reviewed today with patient. Compliant with blood pressure medicine. States does not miss a dose. No obvious side effects. Blood pressure generally good when checked elsewhere. Watching salt intake.   Patient notes ongoing compliance with antidepressant medication. No obvious side effects. Reports does not miss a dose. Overall continues to help depression substantially. No thoughts of homicide or suicide. Would like to maintain medication. If hurting bad less tolerant with folks, prefers to sty to self and not out and about working at [employer]  Thyroid compliant. Notes fatigue at times. Wonders if thyroid is strong enough. Also dry skin. Some weight gain.  Hx of elev chol, diet is decent but not great,   Watching fried foods  Following blood work with the methotrexate infusion  Review of Systems No headache, no major weight loss or weight gain, no chest pain no back pain abdominal pain no change in bowel habits complete ROS otherwise negative     Objective:   Physical Exam Alert and oriented, vitals reviewed and stable, NAD ENT-TM's and ext canals WNL bilat via otoscopic exam Soft palate, tonsils and  post pharynx WNL via oropharyngeal exam Neck-symmetric, no masses; thyroid nonpalpable and nontender Pulmonary-no tachypnea or accessory muscle use; Clear without wheezes via auscultation Card--no abnrml murmurs, rhythm reg and rate WNL Carotid pulses symmetric, without bruits Mental status appropriate       Assessment & Plan:  Impression 1 depression with element of anxiety clinically stable maintain same meds self-help discussed #2 hypertension good control discussed maintain same meds compliance discussed #3 dry skin will try trial of hydrocortisone lotion. #4 hypothyroidism status uncertain will check blood work #5 hyperlipidemia LDL simply too high last year will once again assessed numbers. Lipid profile and tsh

## 2016-08-22 LAB — BASIC METABOLIC PANEL
BUN / CREAT RATIO: 17 (ref 9–23)
BUN: 12 mg/dL (ref 6–24)
CALCIUM: 9.4 mg/dL (ref 8.7–10.2)
CHLORIDE: 101 mmol/L (ref 96–106)
CO2: 24 mmol/L (ref 18–29)
Creatinine, Ser: 0.71 mg/dL (ref 0.57–1.00)
GFR calc Af Amer: 114 mL/min/{1.73_m2} (ref >59)
GFR, EST NON AFRICAN AMERICAN: 99 mL/min/{1.73_m2} (ref >59)
Glucose: 89 mg/dL (ref 65–99)
POTASSIUM: 4.1 mmol/L (ref 3.5–5.2)
SODIUM: 141 mmol/L (ref 134–144)

## 2016-08-22 LAB — LIPID PANEL
CHOL/HDL RATIO: 5.9 ratio — AB (ref 0.0–4.4)
Cholesterol, Total: 243 mg/dL — ABNORMAL HIGH (ref 100–199)
HDL: 41 mg/dL (ref >39)
LDL CALC: 162 mg/dL — AB (ref 0–99)
TRIGLYCERIDES: 198 mg/dL — AB (ref 0–149)
VLDL CHOLESTEROL CAL: 40 mg/dL (ref 5–40)

## 2016-08-22 LAB — TSH: TSH: 2.62 u[IU]/mL (ref 0.450–4.500)

## 2016-08-23 MED ORDER — ATORVASTATIN CALCIUM 20 MG PO TABS: 20.0000 mg | ORAL_TABLET | Freq: Every day | ORAL | 5 refills | Status: DC

## 2016-08-23 NOTE — Addendum Note (Signed)
Addended by: Launa Grill on: 08/23/2016 03:28 PM   Modules accepted: Orders

## 2016-09-05 DIAGNOSIS — M4690 Unspecified inflammatory spondylopathy, site unspecified: Secondary | ICD-10-CM | POA: Diagnosis not present

## 2016-09-05 DIAGNOSIS — K5190 Ulcerative colitis, unspecified, without complications: Secondary | ICD-10-CM | POA: Diagnosis not present

## 2016-09-05 DIAGNOSIS — M533 Sacrococcygeal disorders, not elsewhere classified: Secondary | ICD-10-CM | POA: Diagnosis not present

## 2016-09-05 DIAGNOSIS — Z79899 Other long term (current) drug therapy: Secondary | ICD-10-CM | POA: Diagnosis not present

## 2016-09-10 DIAGNOSIS — K5190 Ulcerative colitis, unspecified, without complications: Secondary | ICD-10-CM | POA: Diagnosis not present

## 2016-10-02 ENCOUNTER — Other Ambulatory Visit: Payer: Self-pay | Admitting: Family Medicine

## 2016-10-02 DIAGNOSIS — H10501 Unspecified blepharoconjunctivitis, right eye: Secondary | ICD-10-CM | POA: Diagnosis not present

## 2016-10-02 NOTE — Telephone Encounter (Signed)
Ok plus 2 monthlyr rf

## 2016-10-05 DIAGNOSIS — N3001 Acute cystitis with hematuria: Secondary | ICD-10-CM | POA: Diagnosis not present

## 2016-10-05 DIAGNOSIS — R300 Dysuria: Secondary | ICD-10-CM | POA: Diagnosis not present

## 2016-10-12 ENCOUNTER — Ambulatory Visit (INDEPENDENT_AMBULATORY_CARE_PROVIDER_SITE_OTHER): Payer: BLUE CROSS/BLUE SHIELD | Admitting: Nurse Practitioner

## 2016-10-12 ENCOUNTER — Encounter: Payer: Self-pay | Admitting: Nurse Practitioner

## 2016-10-12 VITALS — BP 110/72 | Temp 98.9°F | Ht 68.0 in | Wt 187.0 lb

## 2016-10-12 DIAGNOSIS — N3001 Acute cystitis with hematuria: Secondary | ICD-10-CM | POA: Diagnosis not present

## 2016-10-12 LAB — POCT URINALYSIS DIPSTICK
Spec Grav, UA: 1.02
pH, UA: 5

## 2016-10-12 MED ORDER — SULFAMETHOXAZOLE-TRIMETHOPRIM 800-160 MG PO TABS: 1.0000 | ORAL_TABLET | Freq: Two times a day (BID) | ORAL | 0 refills | Status: DC

## 2016-10-15 ENCOUNTER — Encounter: Payer: Self-pay | Admitting: Nurse Practitioner

## 2016-10-15 NOTE — Progress Notes (Signed)
Subjective:  Presents for recheck of UTI. Started on 5/31. Had urinary symptoms, then her urine "looked like wine due to blood". Went to Urgent Care at [facility] on 6/1. Started on antibiotic that starts with a "C", thinks it was Cipro. Diagnosed with UTI. A few days later they called back with urine C&S report, switched antibiotic to Macrobid on 6/4 due to resistance. Has 2 more doses. Still "does not feel right". No fever. No dysuria or frequency but some urgency. Nausea, no vomiting. BMs nl. Mild low back pain. Taking fluids well. No vaginal discharge. Minimal mid pelvic area discomfort. No new sexual partners. Has had a hysterectomy. States urine was growing e coli. Notes not available during office visit.   Objective:   BP 110/72   Temp 98.9 F (37.2 C) (Oral)   Ht 5\' 8"  (1.727 m)   Wt 187 lb (84.8 kg)   BMI 28.43 kg/m  NAD. Alert, oriented. Lungs clear. Heart RRR. Minimal CVA discomfort to percussion. Abdomen soft, non distended, mild suprapubic area discomfort on exam.  Results for orders placed or performed in visit on 10/12/16  POCT urinalysis dipstick  Result Value Ref Range   Color, UA     Clarity, UA     Glucose, UA     Bilirubin, UA +    Ketones, UA     Spec Grav, UA 1.020 1.010 - 1.025   Blood, UA     pH, UA 5.0 5.0 - 8.0   Protein, UA     Urobilinogen, UA  0.2 or 1.0 E.U./dL   Nitrite, UA     Leukocytes, UA  Negative   Urine micro neg.    Assessment:  Acute cystitis with hematuria - Plan: POCT urinalysis dipstick    Plan:   Meds ordered this encounter  Medications  . sulfamethoxazole-trimethoprim (BACTRIM DS,SEPTRA DS) 800-160 MG tablet    Sig: Take 1 tablet by mouth 2 (two) times daily.    Dispense:  14 tablet    Refill:  0    Order Specific Question:   Supervising Provider    Answer:   Mikey Kirschner [[id number]]   Switch antibiotics once more to cover early pyelonephritis as a precaution. Took AZO first few days of UTI. May repeat for 48 hours then stop.  Warning signs reviewed. Call back Monday if no improvement, go to ED sooner if worse. Will check to see if notes are available from urgent care visit. Return if symptoms worsen or fail to improve.

## 2016-10-30 ENCOUNTER — Telehealth: Payer: Self-pay | Admitting: Family Medicine

## 2016-10-30 DIAGNOSIS — K5190 Ulcerative colitis, unspecified, without complications: Secondary | ICD-10-CM | POA: Diagnosis not present

## 2016-10-30 DIAGNOSIS — Z79899 Other long term (current) drug therapy: Secondary | ICD-10-CM | POA: Diagnosis not present

## 2016-10-30 NOTE — Telephone Encounter (Signed)
Patient is requesting letter to excuse her from jury duty due to health issues.She states cant sit that long, if needs to be seen can come in .She is needing letter by 11/14/16.

## 2016-11-02 ENCOUNTER — Telehealth: Payer: Self-pay | Admitting: Family Medicine

## 2016-11-02 NOTE — Telephone Encounter (Signed)
Review blood work results faxed over from [facility].

## 2016-11-05 ENCOUNTER — Encounter: Payer: Self-pay | Admitting: Nurse Practitioner

## 2016-11-05 ENCOUNTER — Telehealth: Payer: Self-pay | Admitting: Family Medicine

## 2016-11-05 NOTE — Telephone Encounter (Signed)
Letter has been printed. Please forward to patient.

## 2016-11-05 NOTE — Telephone Encounter (Signed)
Pt is requesting a letter stating that she is unable to attend jury duty due to her medical issues. Pt is wanting this to be forwarded to [facility]. Pt needs this by fri July 13th.

## 2016-11-05 NOTE — Telephone Encounter (Signed)
Lab work received from rheumatology seems to be in order

## 2016-11-05 NOTE — Telephone Encounter (Signed)
Pt notified letter ready for pick up. 

## 2016-11-13 NOTE — Telephone Encounter (Signed)
Call pt letter will be avail for pick up this afternoon

## 2016-11-13 NOTE — Telephone Encounter (Signed)
Spoke with patient and informed her per Dr.Steve Luking- Letter needed for jury duty is ready for pick up. Patient verbalized understanding.

## 2016-12-04 ENCOUNTER — Other Ambulatory Visit: Payer: Self-pay | Admitting: *Deleted

## 2016-12-04 ENCOUNTER — Telehealth: Payer: Self-pay | Admitting: Family Medicine

## 2016-12-04 MED ORDER — TRAMADOL HCL 50 MG PO TABS
ORAL_TABLET | ORAL | 1 refills | Status: DC
Start: 2016-12-04 — End: 2017-01-15

## 2016-12-04 NOTE — Telephone Encounter (Signed)
Patient is currently taking Tramadol for pain.  She said she is currently taking 1 a day.  She wants to know if she can start taking 2 a day, 1 in the morning, 1 at night on her bad days.

## 2016-12-04 NOTE — Telephone Encounter (Signed)
Last med check April this year. Pt states she has arthritis. Has pain in legs, ankles, hips, low back. States dr Richardson Landry told her to take tramadol once daily and two tylenol. She has had a bad couple of days and wants to take tramadol bid. She is taking remicade infusion tomorrow.

## 2016-12-04 NOTE — Telephone Encounter (Signed)
rx sent to pharm pt notified.

## 2016-12-04 NOTE — Telephone Encounter (Signed)
They have prescription tramadol 50 mg 1 twice a day #60 one refill needs follow-up visit within the next 3-6 weeks

## 2016-12-05 DIAGNOSIS — K5190 Ulcerative colitis, unspecified, without complications: Secondary | ICD-10-CM | POA: Diagnosis not present

## 2016-12-12 DIAGNOSIS — Z79899 Other long term (current) drug therapy: Secondary | ICD-10-CM | POA: Diagnosis not present

## 2016-12-12 DIAGNOSIS — M533 Sacrococcygeal disorders, not elsewhere classified: Secondary | ICD-10-CM | POA: Diagnosis not present

## 2016-12-12 DIAGNOSIS — K5190 Ulcerative colitis, unspecified, without complications: Secondary | ICD-10-CM | POA: Diagnosis not present

## 2016-12-12 DIAGNOSIS — M4690 Unspecified inflammatory spondylopathy, site unspecified: Secondary | ICD-10-CM | POA: Diagnosis not present

## 2016-12-21 ENCOUNTER — Encounter: Payer: Self-pay | Admitting: Family Medicine

## 2017-01-01 ENCOUNTER — Encounter: Payer: Self-pay | Admitting: Family Medicine

## 2017-01-01 ENCOUNTER — Ambulatory Visit (INDEPENDENT_AMBULATORY_CARE_PROVIDER_SITE_OTHER): Payer: BLUE CROSS/BLUE SHIELD | Admitting: Family Medicine

## 2017-01-01 VITALS — BP 124/80 | Ht 68.0 in | Wt 189.0 lb

## 2017-01-01 DIAGNOSIS — M79609 Pain in unspecified limb: Secondary | ICD-10-CM | POA: Diagnosis not present

## 2017-01-01 DIAGNOSIS — R202 Paresthesia of skin: Secondary | ICD-10-CM

## 2017-01-01 NOTE — Progress Notes (Signed)
   Subjective:    Patient ID: Robin Gardner, female    DOB: August 17, 1965, 51 y.o.   MRN: [id number] Patient arrives office with symptomatology that she is concerned about HPIleft arm numbness and tingling from elbow to hand. Started today but has happened in the past.    There is family history of heart disease. Patient's had no chest pain no diaphoresis no shortness of breath no exertional component. Achiness in the upper arm with tingling and numbness from the elbow down to the hands and fingers. Came on today. No noticeable weakness.  No symptoms and face or leg. Some history of chronic back pain but no history of sciatica or neuropathic pain.  Had a similar spell a couple times in the past.  Has noted no weakness associated  Cannot take anti-inflammatories because of ulcerative colitis.  Definitely does not want take prednisone because of prior side effects   Tingling to had  In upper rm with aching disconfort  And numbness extendinng ino the hand   No weakness at all  Has experienced in the past   Review of Systems No headache, no major weight loss or weight gain, no chest pain no back pain abdominal pain no change in bowel habits complete ROS otherwise negative     Objective:   Physical Exam  Alert and oriented, vitals reviewed and stable, NAD ENT-TM's and ext canals WNL bilat via otoscopic exam Soft palate, tonsils and post pharynx WNL via oropharyngeal exam Neck-symmetric, no masses; thyroid nonpalpable and nontender Pulmonary-no tachypnea or accessory muscle use; Clear without wheezes via auscultation Card--no abnrml murmurs, rhythm reg and rate WNL Carotid pulses symmetric, without bruits Negative Tinel sign negative Phalen sign perhaps slight diminished sensation left lateral hand and forearm. Arm good strength decent tendon reflexes intact neck supple.     Assessment & Plan:  Impression 1 tingling paresthesias with some achiness in the forearm. This could  represent peripheral neuropathy such as ulnar nerve entrapment at the elbow or cervical neuropathy. Discussed. Highly doubt cardiac and acute stroke. Rationale discussed with patient. No need for emergency ER visit. At this time. Warning signs discussed including progression of symptomatology. Patient declines steroids. To call back if persists and we will set up with neurologist for nerve conduction studies etc.  Greater than 50% of this 25 minute face to face visit was spent in counseling and discussion and coordination of care regarding the above diagnosis/diagnosies

## 2017-01-15 ENCOUNTER — Other Ambulatory Visit: Payer: Self-pay | Admitting: Family Medicine

## 2017-01-15 NOTE — Telephone Encounter (Signed)
Ok times one with two ref

## 2017-01-16 DIAGNOSIS — K5190 Ulcerative colitis, unspecified, without complications: Secondary | ICD-10-CM | POA: Diagnosis not present

## 2017-02-19 ENCOUNTER — Ambulatory Visit: Payer: BLUE CROSS/BLUE SHIELD | Admitting: Family Medicine

## 2017-02-20 ENCOUNTER — Ambulatory Visit: Payer: BLUE CROSS/BLUE SHIELD | Admitting: Family Medicine

## 2017-02-26 ENCOUNTER — Encounter: Payer: Self-pay | Admitting: Family Medicine

## 2017-02-26 ENCOUNTER — Ambulatory Visit (INDEPENDENT_AMBULATORY_CARE_PROVIDER_SITE_OTHER): Payer: BLUE CROSS/BLUE SHIELD | Admitting: Family Medicine

## 2017-02-26 VITALS — BP 142/88 | Ht 68.0 in | Wt 190.0 lb

## 2017-02-26 DIAGNOSIS — R5383 Other fatigue: Secondary | ICD-10-CM | POA: Diagnosis not present

## 2017-02-26 DIAGNOSIS — F321 Major depressive disorder, single episode, moderate: Secondary | ICD-10-CM | POA: Diagnosis not present

## 2017-02-26 DIAGNOSIS — I10 Essential (primary) hypertension: Secondary | ICD-10-CM

## 2017-02-26 DIAGNOSIS — Z79899 Other long term (current) drug therapy: Secondary | ICD-10-CM

## 2017-02-26 DIAGNOSIS — E039 Hypothyroidism, unspecified: Secondary | ICD-10-CM | POA: Diagnosis not present

## 2017-02-26 DIAGNOSIS — Z13220 Encounter for screening for lipoid disorders: Secondary | ICD-10-CM

## 2017-02-26 MED ORDER — ATORVASTATIN CALCIUM 20 MG PO TABS: 20.0000 mg | ORAL_TABLET | Freq: Every day | ORAL | 1 refills | Status: DC

## 2017-02-26 MED ORDER — SERTRALINE HCL 100 MG PO TABS: 100.0000 mg | ORAL_TABLET | Freq: Every day | ORAL | 1 refills | Status: DC

## 2017-02-26 NOTE — Progress Notes (Signed)
   Subjective:    Patient ID: Robin Gardner, female    DOB: [birth year], 51 y.o.   MRN: [id number]  HPI Patient is here today to follow up on hypothyroidism.She is currently on Levothyroxine 75 mcg one daily. She states she still has no energy and feels like she wants to take a nap all the time.She states she eats healthy and exercises by walking some.  Patient continues to take lipid medication regularly. No obvious side effects from it. Generally does not miss a dose. Prior blood work results are reviewed with patient. Patient continues to work on fat intake in diet Pt took chol med and seemed to handle ok  Takes thyr med faithfully, takes it in the morn    more incr stress lately and near panic atacks using xnax prn,  Patient notes ongoing compliance with antidepressant medication. No obvious side effects. Reports does not miss a dose. Overall continues to help depression substantially. No thoughts of homicide or suicide. Would like to maintain  Ongoing challenges with ulcerative colitis.  Patient also gets secondary arthritis challenges with this.  On regular Remicade injections which cause fatigue.  Review of Systems No headache, no major weight loss or weight gain, no chest pain no back pain abdominal pain no change in bowel habits complete ROS otherwise negative     Objective:   Physical Exam Alert and oriented, vitals reviewed and stable, NAD ENT-TM's and ext canals WNL bilat via otoscopic exam Soft palate, tonsils and post pharynx WNL via oropharyngeal exam Neck-symmetric, no masses; thyroid nonpalpable and nontender Pulmonary-no tachypnea or accessory muscle use; Clear without wheezes via auscultation Card--no abnrml murmurs, rhythm reg and rate WNL Carotid pulses symmetric, without bruits        Assessment & Plan:  Impression 1 depression clinically stable reviewed will maintain same dose of meds rationale discussed compliance discussed  2.  Hyperlipidemia status  uncertain.  Will check blood work further recommendations based on results  3.  Hypothyroidism.  Status uncertain.  His compliance reviewed.  We will check further blood work.  4.  Fatigue discussed at length.  Likely due to all of the above plus chronic ulcerative colitis and secondary features in interventional medication for it  Plan all medications refilled.  Diet exercise discussed.  Appropriate blood work.  Follow-up as scheduled.  [facility]

## 2017-02-27 DIAGNOSIS — K5190 Ulcerative colitis, unspecified, without complications: Secondary | ICD-10-CM | POA: Diagnosis not present

## 2017-03-01 ENCOUNTER — Other Ambulatory Visit: Payer: Self-pay | Admitting: Family Medicine

## 2017-03-22 ENCOUNTER — Other Ambulatory Visit: Payer: Self-pay | Admitting: Family Medicine

## 2017-04-04 ENCOUNTER — Ambulatory Visit (INDEPENDENT_AMBULATORY_CARE_PROVIDER_SITE_OTHER): Payer: BLUE CROSS/BLUE SHIELD | Admitting: Otolaryngology

## 2017-04-04 DIAGNOSIS — H9313 Tinnitus, bilateral: Secondary | ICD-10-CM

## 2017-04-04 DIAGNOSIS — H903 Sensorineural hearing loss, bilateral: Secondary | ICD-10-CM | POA: Diagnosis not present

## 2017-04-09 DIAGNOSIS — K5190 Ulcerative colitis, unspecified, without complications: Secondary | ICD-10-CM | POA: Diagnosis not present

## 2017-04-12 ENCOUNTER — Other Ambulatory Visit: Payer: Self-pay | Admitting: Family Medicine

## 2017-04-16 NOTE — Telephone Encounter (Signed)
Six months worth apraz and thyr

## 2017-04-16 NOTE — Telephone Encounter (Signed)
Dr Mariane Duval

## 2017-04-23 DIAGNOSIS — L821 Other seborrheic keratosis: Secondary | ICD-10-CM | POA: Diagnosis not present

## 2017-04-23 DIAGNOSIS — L820 Inflamed seborrheic keratosis: Secondary | ICD-10-CM | POA: Diagnosis not present

## 2017-04-23 DIAGNOSIS — Z1283 Encounter for screening for malignant neoplasm of skin: Secondary | ICD-10-CM | POA: Diagnosis not present

## 2017-05-21 DIAGNOSIS — K5190 Ulcerative colitis, unspecified, without complications: Secondary | ICD-10-CM | POA: Diagnosis not present

## 2017-05-27 DIAGNOSIS — Z79899 Other long term (current) drug therapy: Secondary | ICD-10-CM | POA: Diagnosis not present

## 2017-05-27 DIAGNOSIS — K5190 Ulcerative colitis, unspecified, without complications: Secondary | ICD-10-CM | POA: Diagnosis not present

## 2017-05-27 DIAGNOSIS — M533 Sacrococcygeal disorders, not elsewhere classified: Secondary | ICD-10-CM | POA: Diagnosis not present

## 2017-05-27 DIAGNOSIS — M4690 Unspecified inflammatory spondylopathy, site unspecified: Secondary | ICD-10-CM | POA: Diagnosis not present

## 2017-06-11 DIAGNOSIS — H5213 Myopia, bilateral: Secondary | ICD-10-CM | POA: Diagnosis not present

## 2017-06-11 DIAGNOSIS — H2513 Age-related nuclear cataract, bilateral: Secondary | ICD-10-CM | POA: Diagnosis not present

## 2017-06-11 DIAGNOSIS — H25043 Posterior subcapsular polar age-related cataract, bilateral: Secondary | ICD-10-CM | POA: Diagnosis not present

## 2017-06-11 DIAGNOSIS — H524 Presbyopia: Secondary | ICD-10-CM | POA: Diagnosis not present

## 2017-07-02 DIAGNOSIS — K5190 Ulcerative colitis, unspecified, without complications: Secondary | ICD-10-CM | POA: Diagnosis not present

## 2017-07-29 ENCOUNTER — Encounter: Payer: Self-pay | Admitting: Nurse Practitioner

## 2017-07-29 ENCOUNTER — Ambulatory Visit: Payer: BLUE CROSS/BLUE SHIELD | Admitting: Nurse Practitioner

## 2017-07-29 VITALS — BP 126/84 | Temp 98.2°F | Ht 68.0 in | Wt 196.2 lb

## 2017-07-29 DIAGNOSIS — J0110 Acute frontal sinusitis, unspecified: Secondary | ICD-10-CM | POA: Diagnosis not present

## 2017-07-29 DIAGNOSIS — J209 Acute bronchitis, unspecified: Secondary | ICD-10-CM | POA: Diagnosis not present

## 2017-07-29 MED ORDER — AZITHROMYCIN 250 MG PO TABS: ORAL_TABLET | ORAL | 0 refills | Status: DC

## 2017-07-29 MED ORDER — HYDROCODONE-HOMATROPINE 5-1.5 MG/5ML PO SYRP: 5.0000 mL | ORAL_SOLUTION | ORAL | 0 refills | Status: DC | PRN

## 2017-07-29 MED ORDER — ALBUTEROL SULFATE HFA 108 (90 BASE) MCG/ACT IN AERS: 2.0000 | INHALATION_SPRAY | RESPIRATORY_TRACT | 0 refills | Status: DC | PRN

## 2017-07-29 NOTE — Progress Notes (Signed)
Subjective:  Presents for /co sinus congestion and cough for 2 weeks. No fever. Sore throat resolved. Frontal area headache. Runny nose, occasional yellow mucus. Frequent cough. No wheezing but chest tightness with DB or cough. Feels like an "elephant is sitting on her chest" with prolonged cough. Left ear pain. No relief with OTC meds.  Objective:   BP 126/84   Temp 98.2 F (36.8 C) (Oral)   Ht 5\' 8"  (1.727 m)   Wt 196 lb 3.2 oz (89 kg)   SpO2 97%   BMI 29.83 kg/m  NAD. Alert, oriented. TMs clear effusion, no erythema. Pharynx injected with PND noted. Neck supple with mild anterior adenopathy. Lungs rare faint expiratory crackles. No wheezing or tachypnea. Mild SOB with talking. Heart RRR.   Assessment:  Acute non-recurrent frontal sinusitis  Acute bronchitis, unspecified organism    Plan:   Meds ordered this encounter  Medications  . albuterol (PROVENTIL HFA;VENTOLIN HFA) 108 (90 Base) MCG/ACT inhaler    Sig: Inhale 2 puffs into the lungs every 4 (four) hours as needed.    Dispense:  1 Inhaler    Refill:  0    Order Specific Question:   Supervising Provider    Answer:   Mikey Kirschner [[id number]]  . HYDROcodone-homatropine (HYCODAN) 5-1.5 MG/5ML syrup    Sig: Take 5 mLs by mouth every 4 (four) hours as needed.    Dispense:  90 mL    Refill:  0    Order Specific Question:   Supervising Provider    Answer:   Mikey Kirschner [[id number]]  . azithromycin (ZITHROMAX Z-PAK) 250 MG tablet    Sig: Take 2 tablets (500 mg) on  Day 1,  followed by 1 tablet (250 mg) once daily on Days 2 through 5.    Dispense:  6 each    Refill:  0    Order Specific Question:   Supervising Provider    Answer:   Mikey Kirschner [[id number]]   Continue OTC meds as directed. Call back by the end of the week if no improvement, sooner if worse. Warning signs were reviewed.

## 2017-08-01 ENCOUNTER — Telehealth: Payer: Self-pay | Admitting: Family Medicine

## 2017-08-01 MED ORDER — CEFPROZIL 500 MG PO TABS: 500.0000 mg | ORAL_TABLET | Freq: Two times a day (BID) | ORAL | 0 refills | Status: AC

## 2017-08-01 NOTE — Telephone Encounter (Signed)
I called and left a vm to r/c. 

## 2017-08-01 NOTE — Telephone Encounter (Signed)
Patient was prescribed a z-pak by Hoyle Sauer on 07/29/17 for sinusitis.  She has 1 more day of the antibiotic and wants to know if another antibiotic can be called in because she still has a lot of cough and congestion.  [employer]

## 2017-08-01 NOTE — Telephone Encounter (Signed)
Patient states she is not coughing near as much,but it is still present she states she is not runny a fever,but still has head and chest congestion. One day of antibx left would like something else called in. Please advise.

## 2017-08-01 NOTE — Telephone Encounter (Signed)
Patient aware.

## 2017-08-01 NOTE — Telephone Encounter (Signed)
cefzil 500 bid ten d 

## 2017-08-05 ENCOUNTER — Telehealth: Payer: Self-pay | Admitting: Family Medicine

## 2017-08-05 ENCOUNTER — Other Ambulatory Visit: Payer: Self-pay | Admitting: Nurse Practitioner

## 2017-08-05 MED ORDER — ONDANSETRON 8 MG PO TBDP: 8.0000 mg | ORAL_TABLET | Freq: Three times a day (TID) | ORAL | 0 refills | Status: DC | PRN

## 2017-08-05 NOTE — Telephone Encounter (Signed)
Left message to return call 

## 2017-08-05 NOTE — Telephone Encounter (Signed)
Pt returned call and was informed that nausea med was sent in

## 2017-08-05 NOTE — Telephone Encounter (Signed)
Will send in something for nausea. If she is not any better or certainly any worse over the next few days, recommend an office visit. Otherwise, see if she can continue with current antibiotic.

## 2017-08-05 NOTE — Telephone Encounter (Signed)
Patient seen Robin Gardner on 07/29/17 for sinusitis.  Shewas prescribed Cefzil on 08/01/17 by Dr. Richardson Landry.  Robin Gardner thinks this is causing her to have nausea and wants something for nausea called in.  Also, she started blowing green congestion yesterday and has a lot of croupy congestion in her chest.  Please advise.  [employer]

## 2017-08-07 ENCOUNTER — Telehealth: Payer: Self-pay | Admitting: Family Medicine

## 2017-08-07 NOTE — Telephone Encounter (Signed)
Patient seen Robin Gardner on 07/29/17 for sinusitis.  She said she started her new antibiotic, Cefzil, on Saturday. She still has a cough, but not as as bad.  Still having head congestion.  She said the last time she was able to blow her nose, she had green phlegm but she can barely get anything up.  She wants to know if something else can be called in for her to try?  [employer]

## 2017-08-07 NOTE — Telephone Encounter (Signed)
Is she using her inhaler? Does it help?

## 2017-08-07 NOTE — Telephone Encounter (Signed)
No fever, just exhausted from the cough. Sometimes chest feels heavy but not really short of breath

## 2017-08-08 ENCOUNTER — Other Ambulatory Visit: Payer: Self-pay | Admitting: Nurse Practitioner

## 2017-08-08 MED ORDER — PREDNISONE 20 MG PO TABS: ORAL_TABLET | ORAL | 0 refills | Status: DC

## 2017-08-08 NOTE — Telephone Encounter (Signed)
PMP reviewed

## 2017-08-08 NOTE — Telephone Encounter (Signed)
Prednisone taper sent to pharmacy and pt notified to take Mucinex and call for appt if symptoms continue.

## 2017-08-08 NOTE — Telephone Encounter (Signed)
As we discussed during her office visit. Consider oral Prednisone brief taper. Also take Mucinex to help thin out secretions. Just called in refill on her cough med. Recommend office visit if her symptoms continue.

## 2017-08-08 NOTE — Telephone Encounter (Signed)
Only used inhaler twice which was right after she got it. Did help when she used it though. Has not used it recently. Still coughing head off. Has taken all the cough syrup. Cant cough up anything now, stuck in head. Still on Cefzil.

## 2017-08-13 ENCOUNTER — Other Ambulatory Visit: Payer: Self-pay | Admitting: Family Medicine

## 2017-08-13 NOTE — Telephone Encounter (Signed)
Ok times one 

## 2017-08-20 ENCOUNTER — Ambulatory Visit (INDEPENDENT_AMBULATORY_CARE_PROVIDER_SITE_OTHER): Payer: BLUE CROSS/BLUE SHIELD | Admitting: Family Medicine

## 2017-08-20 ENCOUNTER — Encounter: Payer: Self-pay | Admitting: Family Medicine

## 2017-08-20 VITALS — BP 128/88 | Ht 68.0 in | Wt 192.1 lb

## 2017-08-20 DIAGNOSIS — E785 Hyperlipidemia, unspecified: Secondary | ICD-10-CM | POA: Diagnosis not present

## 2017-08-20 DIAGNOSIS — E039 Hypothyroidism, unspecified: Secondary | ICD-10-CM | POA: Diagnosis not present

## 2017-08-20 DIAGNOSIS — I10 Essential (primary) hypertension: Secondary | ICD-10-CM | POA: Diagnosis not present

## 2017-08-20 DIAGNOSIS — R5383 Other fatigue: Secondary | ICD-10-CM

## 2017-08-20 DIAGNOSIS — Z79899 Other long term (current) drug therapy: Secondary | ICD-10-CM | POA: Diagnosis not present

## 2017-08-20 DIAGNOSIS — Z13220 Encounter for screening for lipoid disorders: Secondary | ICD-10-CM

## 2017-08-20 DIAGNOSIS — F321 Major depressive disorder, single episode, moderate: Secondary | ICD-10-CM | POA: Diagnosis not present

## 2017-08-20 MED ORDER — ATORVASTATIN CALCIUM 20 MG PO TABS: 20.0000 mg | ORAL_TABLET | Freq: Every day | ORAL | 1 refills | Status: DC

## 2017-08-20 MED ORDER — ALBUTEROL SULFATE HFA 108 (90 BASE) MCG/ACT IN AERS: 2.0000 | INHALATION_SPRAY | RESPIRATORY_TRACT | 0 refills | Status: DC | PRN

## 2017-08-20 MED ORDER — HYDROCODONE-HOMATROPINE 5-1.5 MG/5ML PO SYRP: ORAL_SOLUTION | ORAL | 0 refills | Status: DC

## 2017-08-20 MED ORDER — FLUCONAZOLE 150 MG PO TABS: ORAL_TABLET | ORAL | 0 refills | Status: DC

## 2017-08-20 MED ORDER — LEVOTHYROXINE SODIUM 75 MCG PO TABS: 75.0000 ug | ORAL_TABLET | Freq: Every day | ORAL | 5 refills | Status: DC

## 2017-08-20 MED ORDER — PANTOPRAZOLE SODIUM 40 MG PO TBEC: 40.0000 mg | DELAYED_RELEASE_TABLET | Freq: Two times a day (BID) | ORAL | 5 refills | Status: DC

## 2017-08-20 MED ORDER — SERTRALINE HCL 100 MG PO TABS
100.0000 mg | ORAL_TABLET | Freq: Every day | ORAL | 1 refills | Status: DC
Start: 2017-08-20 — End: 2018-01-01

## 2017-08-20 NOTE — Progress Notes (Signed)
   Subjective:    Patient ID: Robin Gardner, female    DOB: 1965-08-19, 52 y.o.   MRN: [id number] Patient presents with numerous concerns HPI Patient is here today to follow up on her chronic health issues. She is currently taking Levothyroxine 75 mg for her hypothyroidism and one daily.   She is also on Lipitor 20 mg Qd for her Cholesterol issues and also taking her antidepressant medication Zoloft 100 mg Qd.  Patient notes ongoing compliance with antidepressant medication. No obvious side effects. Reports does not miss a dose. Overall continues to help depression substantially. No thoughts of homicide or suicide. Would like to maintain medication.   Patient still has a cough she can not get rid of and states she has a yeast infx and will need medication for this.not using the inhaler much at this point. Some prodcutive.  Persistent cough.  Had substantial wheezes before  Patient continues to take lipid medication regularly. No obvious side effects from it. Generally does not miss a dose. Prior blood work results are reviewed with patient. Patient continues to work on fat intake in diet  Patient notes ongoing compliance with antidepressant medication. No obvious side effects. Reports does not miss a dose. Overall continues to help depression substantially. No thoughts of homicide or suicide. Would like to maintain medication.  Compliant with thyroid meds.  No symptoms of high or low.   Results for orders placed or performed in visit on 10/12/16  POCT urinalysis dipstick  Result Value Ref Range   Color, UA     Clarity, UA     Glucose, UA     Bilirubin, UA +    Ketones, UA     Spec Grav, UA 1.020 1.010 - 1.025   Blood, UA     pH, UA 5.0 5.0 - 8.0   Protein, UA     Urobilinogen, UA  0.2 or 1.0 E.U./dL   Nitrite, UA     Leukocytes, UA  Negative    Did not take the prednisone    Exercise walking almost daily, two laps often every other day      Review of Systems No  headache, no major weight loss or weight gain, no chest pain no back pain abdominal pain no change in bowel habits complete ROS otherwise negative     Objective:   Physical Exam  Alert and oriented, vitals reviewed and stable, NAD ENT-TM's and ext canals WNL bilat via otoscopic exam Soft palate, tonsils and post pharynx WNL via oropharyngeal exam Neck-symmetric, no masses; thyroid nonpalpable and nontender Pulmonary-no tachypnea or accessory muscle use; Clear without wheezes via auscultation Card--no abnrml murmurs, rhythm reg and rate WNL Carotid pulses symmetric, without bruits   Intermittent wheezy cough during exam      Assessment & Plan:  Impression 1 hypertension discussed good control.  Maintain same dose of medicine rationale discussed  2.  Hyperlipidemia.  Prior results discussed.  Appropriate blood work maintain same meds for now  3.  Hypothyroidism status uncertain meds refilled check TSH  4.  Persistent reactive airway cough.  Patient does not want prednisone.  Encouraged to use albuterol very regularly rationale discussed

## 2017-08-21 LAB — HEPATIC FUNCTION PANEL
ALK PHOS: 75 IU/L (ref 39–117)
ALT: 29 IU/L (ref 0–32)
AST: 27 IU/L (ref 0–40)
Albumin: 4.9 g/dL (ref 3.5–5.5)
BILIRUBIN TOTAL: 0.5 mg/dL (ref 0.0–1.2)
BILIRUBIN, DIRECT: 0.14 mg/dL (ref 0.00–0.40)
Total Protein: 7.5 g/dL (ref 6.0–8.5)

## 2017-08-21 LAB — BASIC METABOLIC PANEL
BUN/Creatinine Ratio: 17 (ref 9–23)
BUN: 13 mg/dL (ref 6–24)
CALCIUM: 9.4 mg/dL (ref 8.7–10.2)
CO2: 24 mmol/L (ref 20–29)
Chloride: 101 mmol/L (ref 96–106)
Creatinine, Ser: 0.77 mg/dL (ref 0.57–1.00)
GFR calc Af Amer: 103 mL/min/{1.73_m2} (ref >59)
GFR, EST NON AFRICAN AMERICAN: 89 mL/min/{1.73_m2} (ref >59)
GLUCOSE: 92 mg/dL (ref 65–99)
Potassium: 4.3 mmol/L (ref 3.5–5.2)
SODIUM: 139 mmol/L (ref 134–144)

## 2017-08-21 LAB — LIPID PANEL
CHOLESTEROL TOTAL: 143 mg/dL (ref 100–199)
Chol/HDL Ratio: 3.3 ratio (ref 0.0–4.4)
HDL: 44 mg/dL (ref >39)
LDL Calculated: 73 mg/dL (ref 0–99)
Triglycerides: 131 mg/dL (ref 0–149)
VLDL Cholesterol Cal: 26 mg/dL (ref 5–40)

## 2017-08-21 LAB — TSH: TSH: 3.77 u[IU]/mL (ref 0.450–4.500)

## 2017-08-25 ENCOUNTER — Encounter: Payer: Self-pay | Admitting: Family Medicine

## 2017-09-04 DIAGNOSIS — M25572 Pain in left ankle and joints of left foot: Secondary | ICD-10-CM | POA: Diagnosis not present

## 2017-09-04 DIAGNOSIS — M2550 Pain in unspecified joint: Secondary | ICD-10-CM | POA: Diagnosis not present

## 2017-09-04 DIAGNOSIS — K5190 Ulcerative colitis, unspecified, without complications: Secondary | ICD-10-CM | POA: Diagnosis not present

## 2017-09-04 DIAGNOSIS — M533 Sacrococcygeal disorders, not elsewhere classified: Secondary | ICD-10-CM | POA: Diagnosis not present

## 2017-10-01 DIAGNOSIS — L820 Inflamed seborrheic keratosis: Secondary | ICD-10-CM | POA: Diagnosis not present

## 2017-10-01 DIAGNOSIS — D2372 Other benign neoplasm of skin of left lower limb, including hip: Secondary | ICD-10-CM | POA: Diagnosis not present

## 2017-10-01 DIAGNOSIS — I781 Nevus, non-neoplastic: Secondary | ICD-10-CM | POA: Diagnosis not present

## 2017-10-01 DIAGNOSIS — B078 Other viral warts: Secondary | ICD-10-CM | POA: Diagnosis not present

## 2017-10-10 ENCOUNTER — Encounter: Payer: Self-pay | Admitting: Family Medicine

## 2017-10-10 ENCOUNTER — Ambulatory Visit: Payer: BLUE CROSS/BLUE SHIELD | Admitting: Family Medicine

## 2017-10-10 VITALS — BP 122/84 | Temp 99.1°F | Ht 68.0 in | Wt 190.0 lb

## 2017-10-10 DIAGNOSIS — L03032 Cellulitis of left toe: Secondary | ICD-10-CM

## 2017-10-10 MED ORDER — PANTOPRAZOLE SODIUM 40 MG PO TBEC: 40.0000 mg | DELAYED_RELEASE_TABLET | Freq: Two times a day (BID) | ORAL | 5 refills | Status: DC

## 2017-10-10 MED ORDER — DOXYCYCLINE HYCLATE 100 MG PO TABS: 100.0000 mg | ORAL_TABLET | Freq: Two times a day (BID) | ORAL | 0 refills | Status: DC

## 2017-10-10 NOTE — Progress Notes (Signed)
   Subjective:    Patient ID: Robin Gardner, female    DOB: [birth year], 52 y.o.   MRN: [id number]  HPIleft great toe pain, redness, and swelling. Tried tylenol and tramadol.  Patient arrives office with left great toe pain swelling and inflammation.  Quite painful at times.  Throbbing at times.  At times sharp at times aching.  No associated fever or chills.  Interestingly patient suffered an injury to the same area several weeks ago.     Review of Systems No headache, no major weight loss or weight gain, no chest pain no back pain abdominal pain no change in bowel habits complete ROS otherwise negative     Objective:   Physical Exam Alert vitals stable, NAD. Blood pressure good on repeat. HEENT normal. Lungs clear. Heart regular rate and rhythm. Pulses foot excellentDistal erythema tenderness swelling to palpation       Assessment & Plan:  Impression soft tissue cellulitis distal great toe.  Antibiotics prescribed.  Local measures discussed.  Warning signs discussed.

## 2017-10-29 DIAGNOSIS — M533 Sacrococcygeal disorders, not elsewhere classified: Secondary | ICD-10-CM | POA: Diagnosis not present

## 2017-10-29 DIAGNOSIS — M4690 Unspecified inflammatory spondylopathy, site unspecified: Secondary | ICD-10-CM | POA: Diagnosis not present

## 2017-10-29 DIAGNOSIS — K5190 Ulcerative colitis, unspecified, without complications: Secondary | ICD-10-CM | POA: Diagnosis not present

## 2017-11-05 DIAGNOSIS — K5190 Ulcerative colitis, unspecified, without complications: Secondary | ICD-10-CM | POA: Diagnosis not present

## 2017-11-19 ENCOUNTER — Other Ambulatory Visit: Payer: Self-pay | Admitting: Family Medicine

## 2017-11-20 NOTE — Telephone Encounter (Signed)
Six mo ok 

## 2017-12-17 ENCOUNTER — Encounter: Payer: Self-pay | Admitting: Family Medicine

## 2017-12-17 ENCOUNTER — Ambulatory Visit (INDEPENDENT_AMBULATORY_CARE_PROVIDER_SITE_OTHER): Payer: BLUE CROSS/BLUE SHIELD | Admitting: Family Medicine

## 2017-12-17 VITALS — BP 146/88 | Ht 68.0 in | Wt 192.6 lb

## 2017-12-17 DIAGNOSIS — I10 Essential (primary) hypertension: Secondary | ICD-10-CM | POA: Diagnosis not present

## 2017-12-17 DIAGNOSIS — M2550 Pain in unspecified joint: Secondary | ICD-10-CM | POA: Diagnosis not present

## 2017-12-17 DIAGNOSIS — K5190 Ulcerative colitis, unspecified, without complications: Secondary | ICD-10-CM | POA: Diagnosis not present

## 2017-12-17 DIAGNOSIS — Z79899 Other long term (current) drug therapy: Secondary | ICD-10-CM | POA: Diagnosis not present

## 2017-12-17 MED ORDER — AMLODIPINE BESYLATE 2.5 MG PO TABS: 2.5000 mg | ORAL_TABLET | Freq: Every day | ORAL | 1 refills | Status: DC

## 2017-12-17 NOTE — Progress Notes (Signed)
   Subjective:    Patient ID: MEARA WIECHMAN, female    DOB: 1966-03-04, 52 y.o.   MRN: [id number]  HPI  Patient arrives with elevated blood pressure. Patient was notified ar her rheumade infusion 6 weeks ago it was high and it was high again today at infusion.  Blood pressure medicine and blood pressure levels reviewed today with patient. Compliant with blood pressure medicine. States does not miss a dose. No obvious side effects. Blood pressure generally good when checked elsewhere. Watching salt intake.  Not exercising much oncer per week or so  Diet so s o, not th4 best     Pt a bit frustrated about niot exercising   Review of Systems No headache, no major weight loss or weight gain, no chest pain no back pain abdominal pain no change in bowel habits complete ROS otherwise negative     Objective:   Physical Exam Alert vitals stable, NAD. Blood pressure good on repeat. HEENT normal. Lungs clear. Heart regular rate and rhythm. Blood pressure elevated both arms systolic 1 55-9 50 diastolic 98       Assessment & Plan:  Impression essential hypertension.  Patient came off medication for a little while, but now needs to get back on.  Rationale discussed medication refill follow-up regular appointment this fall diet exercise salt intake discussed

## 2017-12-26 ENCOUNTER — Telehealth: Payer: Self-pay | Admitting: Family Medicine

## 2017-12-26 MED ORDER — PRAVASTATIN SODIUM 20 MG PO TABS: 20.0000 mg | ORAL_TABLET | Freq: Every day | ORAL | 1 refills | Status: DC

## 2017-12-26 NOTE — Telephone Encounter (Signed)
pravochol 20 once dail six mo worth

## 2017-12-26 NOTE — Telephone Encounter (Signed)
Please advise 

## 2017-12-26 NOTE — Telephone Encounter (Signed)
Wants to know if we can change her cholesterol medicine because it is causing dry eyes and she is having to use a lot of eye drops.  Taking Lipitor 20 mg now.  [employer]

## 2017-12-26 NOTE — Telephone Encounter (Signed)
Patient is aware of all. 

## 2017-12-26 NOTE — Telephone Encounter (Signed)
Medication sent to requested pharmacy.

## 2018-01-01 ENCOUNTER — Other Ambulatory Visit: Payer: Self-pay | Admitting: Family Medicine

## 2018-01-01 DIAGNOSIS — Z6829 Body mass index (BMI) 29.0-29.9, adult: Secondary | ICD-10-CM | POA: Diagnosis not present

## 2018-01-01 DIAGNOSIS — Z01419 Encounter for gynecological examination (general) (routine) without abnormal findings: Secondary | ICD-10-CM | POA: Diagnosis not present

## 2018-01-01 DIAGNOSIS — Z1231 Encounter for screening mammogram for malignant neoplasm of breast: Secondary | ICD-10-CM | POA: Diagnosis not present

## 2018-01-28 DIAGNOSIS — K5190 Ulcerative colitis, unspecified, without complications: Secondary | ICD-10-CM | POA: Diagnosis not present

## 2018-02-11 ENCOUNTER — Ambulatory Visit: Payer: BLUE CROSS/BLUE SHIELD | Admitting: Family Medicine

## 2018-02-11 ENCOUNTER — Encounter: Payer: Self-pay | Admitting: Family Medicine

## 2018-02-11 VITALS — BP 136/82 | Temp 98.3°F | Ht 68.0 in | Wt 194.0 lb

## 2018-02-11 DIAGNOSIS — R0789 Other chest pain: Secondary | ICD-10-CM

## 2018-02-11 DIAGNOSIS — M546 Pain in thoracic spine: Secondary | ICD-10-CM | POA: Diagnosis not present

## 2018-02-11 DIAGNOSIS — R079 Chest pain, unspecified: Secondary | ICD-10-CM

## 2018-02-11 MED ORDER — PREDNISONE 20 MG PO TABS: ORAL_TABLET | ORAL | 0 refills | Status: DC

## 2018-02-11 NOTE — Progress Notes (Signed)
Subjective:    Patient ID: Robin Gardner, female    DOB: Apr 25, 1966, 52 y.o.   MRN: [id number]  Back Pain  The current episode started 1 to 4 weeks ago. Radiates to: base of neck and underneath shoulder blades. Associated symptoms include chest pain. Pertinent negatives include no abdominal pain, numbness or weakness. She has tried heat (Tylenol, Tramadol) for the symptoms. The treatment provided no relief.   Reports constant pain in between shoulder blades x 1.5 weeks, pain worse the last 4 days. Pain radiates to base of neck. Described as dull/achey, reported 8/10. Heat helps relax it some, states tramadol helps her sleep at night. Unable to take NSAIDS d/t hx of ulcerative colitis. Denies any known injury or change in activity. Pain is worse with lying down and certain movements.  Reports mid chest pressure starting today,  denies shortness of breath, denies N/V, denies radiation to left arm.  Has had pain between shoulder blades before but it has always gone away without intervention.   Denies any personal hx of heart problems. Reports father had HTN, MI in Aug 19, 1998, passed away from a brain bleed at age 69 in 08-19-15.   Review of Systems  Respiratory: Negative for shortness of breath.   Cardiovascular: Positive for chest pain.  Gastrointestinal: Negative for abdominal pain, nausea and vomiting.  Musculoskeletal: Positive for back pain and neck pain.  Neurological: Negative for weakness and numbness.       Objective:   Physical Exam  Constitutional: She appears well-developed and well-nourished. No distress.  HENT:  Head: Normocephalic and atraumatic.  Neck: Neck supple.  Negative for c-spine tenderness  Cardiovascular: Normal rate, regular rhythm and normal heart sounds.  No murmur heard. Pulmonary/Chest: Effort normal and breath sounds normal. No respiratory distress.  Musculoskeletal:  Spinal ROM intact, reports discomfort with ROM, primarily in right scapular area.  Tenderness to palpation between shoulder blades, worse around right scapula and right side of neck. Negative for tenderness along spinal column. Tender to palpation over right chest wall.  Nursing note and vitals reviewed.     Assessment & Plan:  1. Acute right-sided thoracic back pain Strongly feel that the likelihood of this being cardiac in etiology is low, EKG today was reassuring with NSR with no evidence of ST-T changes, also reviewed by Dr. Nicki Reaper. We will get chest x-ray and thoracic spine x-ray for further evaluation of her back pain, could possibly be a pinched nerve.  Would expect much more tenderness on palpation with primarily muscular involvement.  She is unable to take NSAIDS d/t UC, will rx prednisone to help with inflammation currently. May continue to take tylenol and tramadol prn. We will call her with xray results, encouraged her to get those done tonigth and we will have results tomorrow. Also requesting she call on Friday to let us know how she is doing, and f/u with [place] in 2 weeks as well. Warning signs discussed, discussed if she continues with chest pain, it worsens, or develops associated s/s of MI she needs to seek emergency care at ED. She verbalized understanding.  Dr. Sallee Lange was consulted on this case, he also examined the patient and is in agreement with the above treatment plan.   25 minutes was spent with the patient.  This statement verifies that 25 minutes was indeed spent with the patient.  More than 50% of this visit-total duration of the visit-was spent in counseling and coordination of care. The issues that the patient came in  for today as reflected in the diagnosis (s) please refer to documentation for further details.

## 2018-02-12 ENCOUNTER — Other Ambulatory Visit: Payer: Self-pay | Admitting: Family Medicine

## 2018-02-14 ENCOUNTER — Encounter: Payer: Self-pay | Admitting: Family Medicine

## 2018-02-19 ENCOUNTER — Ambulatory Visit: Payer: BLUE CROSS/BLUE SHIELD | Admitting: Family Medicine

## 2018-03-04 ENCOUNTER — Ambulatory Visit: Payer: BLUE CROSS/BLUE SHIELD | Admitting: Family Medicine

## 2018-03-10 ENCOUNTER — Ambulatory Visit (INDEPENDENT_AMBULATORY_CARE_PROVIDER_SITE_OTHER): Payer: BLUE CROSS/BLUE SHIELD | Admitting: Family Medicine

## 2018-03-10 ENCOUNTER — Encounter: Payer: Self-pay | Admitting: Family Medicine

## 2018-03-10 VITALS — BP 132/82 | Ht 68.0 in | Wt 194.0 lb

## 2018-03-10 DIAGNOSIS — F321 Major depressive disorder, single episode, moderate: Secondary | ICD-10-CM | POA: Diagnosis not present

## 2018-03-10 DIAGNOSIS — I10 Essential (primary) hypertension: Secondary | ICD-10-CM

## 2018-03-10 DIAGNOSIS — E785 Hyperlipidemia, unspecified: Secondary | ICD-10-CM

## 2018-03-10 DIAGNOSIS — Z13220 Encounter for screening for lipoid disorders: Secondary | ICD-10-CM

## 2018-03-10 DIAGNOSIS — E039 Hypothyroidism, unspecified: Secondary | ICD-10-CM

## 2018-03-10 MED ORDER — SERTRALINE HCL 100 MG PO TABS: 100.0000 mg | ORAL_TABLET | Freq: Every day | ORAL | 1 refills | Status: AC

## 2018-03-10 MED ORDER — AMLODIPINE BESYLATE 2.5 MG PO TABS: 2.5000 mg | ORAL_TABLET | Freq: Every day | ORAL | 1 refills | Status: DC

## 2018-03-10 MED ORDER — LEVOTHYROXINE SODIUM 75 MCG PO TABS: 75.0000 ug | ORAL_TABLET | Freq: Every day | ORAL | 5 refills | Status: DC

## 2018-03-10 MED ORDER — PRAVASTATIN SODIUM 20 MG PO TABS: 20.0000 mg | ORAL_TABLET | Freq: Every day | ORAL | 1 refills | Status: DC

## 2018-03-10 NOTE — Progress Notes (Signed)
   Subjective:    Patient ID: Robin Gardner, female    DOB: December 30, 1965, 52 y.o.   MRN: [id number]  Hypertension  This is a chronic problem. The current episode started more than 1 year ago. Risk factors for coronary artery disease include dyslipidemia. Treatments tried: vasotec. There are no compliance problems.    Blood pressure medicine and blood pressure levels reviewed today with patient. Compliant with blood pressure medicine. States does not miss a dose. No obvious side effects. Blood pressure generally good when checked elsewhere. Watching salt intake.    Patient continues to take lipid medication regularly. No obvious side effects from it. Generally does not miss a dose. Prior blood work results are reviewed with patient. Patient continues to work on fat intake in diet  Back pain has calmed down  Tooth achey in nature   Recalls no injury and no activities  Does some lifting but none recently    BPs odk  As far as pt knows  Diet overll decent,  Hoping for better number   Lots os f stress thee day  s Patient states her back pain is doing much better from last visit  Review of Systems No headache, no major weight loss or weight gain, no chest pain no back pain abdominal pain no change in bowel habits complete ROS otherwise negative     Objective:   Physical Exam  Alert and oriented, vitals reviewed and stable, NAD ENT-TM's and ext canals WNL bilat via otoscopic exam Soft palate, tonsils and post pharynx WNL via oropharyngeal exam Neck-symmetric, no masses; thyroid nonpalpable and nontender Pulmonary-no tachypnea or accessory muscle use; Clear without wheezes via auscultation Card--no abnrml murmurs, rhythm reg and rate WNL Carotid pulses symmetric, without bruits       Assessment & Plan:  Impression 1 hypertension.  Good control discussed to maintain same meds  2.  Thoracic back pain.  Clinically much better.  Symptom care discussed  3.  Hyperlipidemia.   Status uncertain check blood work  4.  Depression ongoing with definite need for meds compliance discussed  5.  Hypothyroidism status uncertain  Follow-up in 6 months.  Appropriate blood work.  Diet exercise discussed

## 2018-03-11 DIAGNOSIS — K5190 Ulcerative colitis, unspecified, without complications: Secondary | ICD-10-CM | POA: Diagnosis not present

## 2018-03-12 DIAGNOSIS — Z13220 Encounter for screening for lipoid disorders: Secondary | ICD-10-CM | POA: Diagnosis not present

## 2018-03-12 DIAGNOSIS — E039 Hypothyroidism, unspecified: Secondary | ICD-10-CM | POA: Diagnosis not present

## 2018-03-12 DIAGNOSIS — I10 Essential (primary) hypertension: Secondary | ICD-10-CM | POA: Diagnosis not present

## 2018-03-13 LAB — TSH: TSH: 3.46 u[IU]/mL (ref 0.450–4.500)

## 2018-03-13 LAB — HEPATIC FUNCTION PANEL
ALK PHOS: 80 IU/L (ref 39–117)
ALT: 22 IU/L (ref 0–32)
AST: 23 IU/L (ref 0–40)
Albumin: 4.5 g/dL (ref 3.5–5.5)
BILIRUBIN TOTAL: 0.3 mg/dL (ref 0.0–1.2)
Bilirubin, Direct: 0.1 mg/dL (ref 0.00–0.40)
Total Protein: 7.2 g/dL (ref 6.0–8.5)

## 2018-03-13 LAB — LIPID PANEL
Chol/HDL Ratio: 4.6 ratio — ABNORMAL HIGH (ref 0.0–4.4)
Cholesterol, Total: 195 mg/dL (ref 100–199)
HDL: 42 mg/dL (ref >39)
LDL CALC: 118 mg/dL — AB (ref 0–99)
TRIGLYCERIDES: 177 mg/dL — AB (ref 0–149)
VLDL CHOLESTEROL CAL: 35 mg/dL (ref 5–40)

## 2018-03-16 ENCOUNTER — Encounter: Payer: Self-pay | Admitting: Family Medicine

## 2018-04-14 ENCOUNTER — Ambulatory Visit (INDEPENDENT_AMBULATORY_CARE_PROVIDER_SITE_OTHER): Payer: BLUE CROSS/BLUE SHIELD | Admitting: Otolaryngology

## 2018-04-14 DIAGNOSIS — H9313 Tinnitus, bilateral: Secondary | ICD-10-CM

## 2018-04-14 DIAGNOSIS — H903 Sensorineural hearing loss, bilateral: Secondary | ICD-10-CM | POA: Diagnosis not present

## 2018-04-16 DIAGNOSIS — K5190 Ulcerative colitis, unspecified, without complications: Secondary | ICD-10-CM | POA: Diagnosis not present

## 2018-04-16 DIAGNOSIS — M533 Sacrococcygeal disorders, not elsewhere classified: Secondary | ICD-10-CM | POA: Diagnosis not present

## 2018-04-16 DIAGNOSIS — M25552 Pain in left hip: Secondary | ICD-10-CM | POA: Diagnosis not present

## 2018-04-16 DIAGNOSIS — M4690 Unspecified inflammatory spondylopathy, site unspecified: Secondary | ICD-10-CM | POA: Diagnosis not present

## 2018-04-16 DIAGNOSIS — Z79899 Other long term (current) drug therapy: Secondary | ICD-10-CM | POA: Diagnosis not present

## 2018-04-22 DIAGNOSIS — K5190 Ulcerative colitis, unspecified, without complications: Secondary | ICD-10-CM | POA: Diagnosis not present

## 2018-05-07 DIAGNOSIS — M797 Fibromyalgia: Secondary | ICD-10-CM

## 2018-05-07 HISTORY — DX: Fibromyalgia: M79.7

## 2018-05-23 DIAGNOSIS — M533 Sacrococcygeal disorders, not elsewhere classified: Secondary | ICD-10-CM | POA: Diagnosis not present

## 2018-05-23 DIAGNOSIS — Z79899 Other long term (current) drug therapy: Secondary | ICD-10-CM | POA: Diagnosis not present

## 2018-05-23 DIAGNOSIS — M7062 Trochanteric bursitis, left hip: Secondary | ICD-10-CM | POA: Diagnosis not present

## 2018-05-23 DIAGNOSIS — M4690 Unspecified inflammatory spondylopathy, site unspecified: Secondary | ICD-10-CM | POA: Diagnosis not present

## 2018-05-23 DIAGNOSIS — K5190 Ulcerative colitis, unspecified, without complications: Secondary | ICD-10-CM | POA: Diagnosis not present

## 2018-06-02 ENCOUNTER — Other Ambulatory Visit: Payer: Self-pay | Admitting: Family Medicine

## 2018-06-02 DIAGNOSIS — Z13820 Encounter for screening for osteoporosis: Secondary | ICD-10-CM | POA: Diagnosis not present

## 2018-06-03 DIAGNOSIS — K5190 Ulcerative colitis, unspecified, without complications: Secondary | ICD-10-CM | POA: Diagnosis not present

## 2018-06-11 ENCOUNTER — Ambulatory Visit: Payer: BLUE CROSS/BLUE SHIELD | Admitting: Family Medicine

## 2018-06-11 ENCOUNTER — Encounter: Payer: Self-pay | Admitting: Family Medicine

## 2018-06-11 VITALS — BP 120/78 | Wt 191.0 lb

## 2018-06-11 DIAGNOSIS — F321 Major depressive disorder, single episode, moderate: Secondary | ICD-10-CM | POA: Diagnosis not present

## 2018-06-11 DIAGNOSIS — M7541 Impingement syndrome of right shoulder: Secondary | ICD-10-CM

## 2018-06-11 DIAGNOSIS — R202 Paresthesia of skin: Secondary | ICD-10-CM

## 2018-06-11 NOTE — Progress Notes (Signed)
   Subjective:    Patient ID: Robin Gardner, female    DOB: 1965-08-21, 53 y.o.   MRN: [id number]  HPI  Patient arrives with numbness and tingling around the mouth for 3 weeks.    Patient also has pain in right shoulder and can barely lift her arm , extension causes pain, trouble extending over head   and noticed tingling in fingers today. , feet feel hot, but not true tingling    Exercise none, mom had open heart surg thtee wks ago, slowly improving     Patient notes ongoing compliance with antidepressant medication. No obvious side effects. Reports does not miss a dose. Overall continues to help depression substantially. No thoughts of homicide or suicide. Would like to maintain medication.  Patient continues to take lipid medication regularly. No obvious side effects from it. Generally does not miss a dose. Prior blood work results are reviewed with patient. Patient continues to work on fat intake in diet     Patient is also having off and on lightheadedness ./ Comes on intermittently Feels a little loopy, hits when changing position     and muscle cramps.  Review of Systems No headache, no major weight loss or weight gain, no chest pain no back pain abdominal pain no change in bowel habits complete ROS otherwise negative     Objective:   Physical Exam Alert and oriented, vitals reviewed and stable, NAD ENT-TM's and ext canals WNL bilat via otoscopic exam Soft palate, tonsils and post pharynx WNL via oropharyngeal exam Neck-symmetric, no masses; thyroid nonpalpable and nontender Pulmonary-no tachypnea or accessory muscle use; Clear without wheezes via auscultation Card--no abnrml murmurs, rhythm reg and rate WNL Carotid pulses symmetric, without bruits Right shoulder positive impingement sign.  Distal strength intact.  Distal sensation intact.  Facial sensation intact light touch.       Assessment & Plan:  Impression #1 paresthesias facial.  Likely related  to anxiety.  Discussed.  Patient compliant with her Zoloft and Xanax.  Feels it definitely helps.  No need for major neurological work-up at this time.  2.  Right shoulder impingement.  Discussed.  Codman's exercises discussed and encouraged.  3.  Stress.  Discussed.  Mother quite sick with serious surgery.  Potentially related to increased risk of symptoms discussed in #1.  Management as noted above.  Follow-up regular appointment  Greater than 50% of this 25 minute face to face visit was spent in counseling and discussion and coordination of care regarding the above diagnosis/diagnosies

## 2018-06-15 MED ORDER — ALPRAZOLAM 0.5 MG PO TABS: ORAL_TABLET | ORAL | 5 refills | Status: DC

## 2018-06-24 DIAGNOSIS — H25813 Combined forms of age-related cataract, bilateral: Secondary | ICD-10-CM | POA: Diagnosis not present

## 2018-06-24 DIAGNOSIS — H5213 Myopia, bilateral: Secondary | ICD-10-CM | POA: Diagnosis not present

## 2018-06-24 DIAGNOSIS — H52203 Unspecified astigmatism, bilateral: Secondary | ICD-10-CM | POA: Diagnosis not present

## 2018-06-24 DIAGNOSIS — H524 Presbyopia: Secondary | ICD-10-CM | POA: Diagnosis not present

## 2018-07-08 DIAGNOSIS — K5190 Ulcerative colitis, unspecified, without complications: Secondary | ICD-10-CM | POA: Diagnosis not present

## 2018-07-28 DIAGNOSIS — L820 Inflamed seborrheic keratosis: Secondary | ICD-10-CM | POA: Diagnosis not present

## 2018-07-28 DIAGNOSIS — L821 Other seborrheic keratosis: Secondary | ICD-10-CM | POA: Diagnosis not present

## 2018-07-28 DIAGNOSIS — D485 Neoplasm of uncertain behavior of skin: Secondary | ICD-10-CM | POA: Diagnosis not present

## 2018-07-28 DIAGNOSIS — L570 Actinic keratosis: Secondary | ICD-10-CM | POA: Diagnosis not present

## 2018-07-28 DIAGNOSIS — Z85828 Personal history of other malignant neoplasm of skin: Secondary | ICD-10-CM | POA: Diagnosis not present

## 2018-07-28 DIAGNOSIS — D225 Melanocytic nevi of trunk: Secondary | ICD-10-CM | POA: Diagnosis not present

## 2018-07-28 DIAGNOSIS — D239 Other benign neoplasm of skin, unspecified: Secondary | ICD-10-CM | POA: Diagnosis not present

## 2018-07-30 DIAGNOSIS — Z79899 Other long term (current) drug therapy: Secondary | ICD-10-CM | POA: Diagnosis not present

## 2018-07-30 DIAGNOSIS — M7062 Trochanteric bursitis, left hip: Secondary | ICD-10-CM | POA: Diagnosis not present

## 2018-07-30 DIAGNOSIS — M4690 Unspecified inflammatory spondylopathy, site unspecified: Secondary | ICD-10-CM | POA: Diagnosis not present

## 2018-07-30 DIAGNOSIS — K5190 Ulcerative colitis, unspecified, without complications: Secondary | ICD-10-CM | POA: Diagnosis not present

## 2018-08-12 DIAGNOSIS — K5190 Ulcerative colitis, unspecified, without complications: Secondary | ICD-10-CM | POA: Diagnosis not present

## 2018-09-05 ENCOUNTER — Ambulatory Visit: Payer: BLUE CROSS/BLUE SHIELD | Admitting: Family Medicine

## 2018-09-16 DIAGNOSIS — K5190 Ulcerative colitis, unspecified, without complications: Secondary | ICD-10-CM | POA: Diagnosis not present

## 2018-10-04 ENCOUNTER — Other Ambulatory Visit: Payer: Self-pay | Admitting: Family Medicine

## 2018-10-06 ENCOUNTER — Ambulatory Visit (INDEPENDENT_AMBULATORY_CARE_PROVIDER_SITE_OTHER): Payer: BLUE CROSS/BLUE SHIELD | Admitting: Family Medicine

## 2018-10-06 ENCOUNTER — Other Ambulatory Visit: Payer: Self-pay

## 2018-10-06 DIAGNOSIS — L03032 Cellulitis of left toe: Secondary | ICD-10-CM

## 2018-10-06 MED ORDER — CICLOPIROX 8 % EX SOLN: Freq: Every day | CUTANEOUS | 5 refills | Status: DC

## 2018-10-06 MED ORDER — DOXYCYCLINE HYCLATE 100 MG PO TABS: 100.0000 mg | ORAL_TABLET | Freq: Two times a day (BID) | ORAL | 0 refills | Status: DC

## 2018-10-06 NOTE — Addendum Note (Signed)
Addended by: Dairl Ponder on: 10/06/2018 11:14 AM   Modules accepted: Orders

## 2018-10-06 NOTE — Progress Notes (Signed)
   Subjective:    Patient ID: Robin Gardner, female    DOB: 03/14/1966, 53 y.o.   MRN: [id number] Audio plus video HPI Patient called and stated her great toe on her left foot became painful, red and swollen a few days ago. Patient states this am it is looking much better and not bothering her. Patient states she had this happen in January as well and it went away before she was seen.  Virtual Visit via Video Note  I connected with Robin Gardner on 10/06/18 at 10:30 AM EDT by a video enabled telemedicine application and verified that I am speaking with the correct person using two identifiers.  Location: Patient: home Provider: office   I discussed the limitations of evaluation and management by telemedicine and the availability of in person appointments. The patient expressed understanding and agreed to proceed.  History of Present Illness:    Observations/Objective:   Assessment and Plan:   Follow Up Instructions:    I discussed the assessment and treatment plan with the patient. The patient was provided an opportunity to ask questions and all were answered. The patient agreed with the plan and demonstrated an understanding of the instructions.   The patient was advised to call back or seek an in-person evaluation if the symptoms worsen or if the condition fails to improve as anticipated.  I provided 18 minutes of non-face-to-face time during this encounter.  Patient originally injured toe over a year ago.  Has not completely healed up well.  Cracking at times.  Somewhat thickened.  Over the last week is developed tenderness at the base of the toe.  Quite sensitive through the weekend.  No fever or chills     Review of Systems No headache, no major weight loss or weight gain, no chest pain no back pain abdominal pain no change in bowel habits complete ROS otherwise negative     Objective:   Physical Exam  Virtual visit can retrain on toe did show thickened  dystrophic nail along with swelling tenderness and erythema at the base of the toenail      Assessment & Plan:  Impression toe cellulitis with potential fungal element of nail.  Patient on many oral medications with her chronic problems would not recommend oral antifungals Penlac nightly.  Doxy twice daily 10 days symptom care discussed warning signs discussed

## 2018-10-21 DIAGNOSIS — K5190 Ulcerative colitis, unspecified, without complications: Secondary | ICD-10-CM | POA: Diagnosis not present

## 2018-10-29 DIAGNOSIS — K219 Gastro-esophageal reflux disease without esophagitis: Secondary | ICD-10-CM | POA: Diagnosis not present

## 2018-10-29 DIAGNOSIS — F331 Major depressive disorder, recurrent, moderate: Secondary | ICD-10-CM | POA: Diagnosis not present

## 2018-10-29 DIAGNOSIS — I10 Essential (primary) hypertension: Secondary | ICD-10-CM | POA: Diagnosis not present

## 2018-10-29 DIAGNOSIS — Z23 Encounter for immunization: Secondary | ICD-10-CM | POA: Diagnosis not present

## 2018-10-29 DIAGNOSIS — E782 Mixed hyperlipidemia: Secondary | ICD-10-CM | POA: Diagnosis not present

## 2018-10-30 DIAGNOSIS — K5190 Ulcerative colitis, unspecified, without complications: Secondary | ICD-10-CM | POA: Diagnosis not present

## 2018-10-30 DIAGNOSIS — Z79899 Other long term (current) drug therapy: Secondary | ICD-10-CM | POA: Diagnosis not present

## 2018-10-30 DIAGNOSIS — M1990 Unspecified osteoarthritis, unspecified site: Secondary | ICD-10-CM | POA: Diagnosis not present

## 2018-10-30 DIAGNOSIS — M4690 Unspecified inflammatory spondylopathy, site unspecified: Secondary | ICD-10-CM | POA: Diagnosis not present

## 2018-11-04 ENCOUNTER — Other Ambulatory Visit: Payer: Self-pay | Admitting: Family Medicine

## 2018-11-25 DIAGNOSIS — K5190 Ulcerative colitis, unspecified, without complications: Secondary | ICD-10-CM | POA: Diagnosis not present

## 2018-12-24 DIAGNOSIS — L570 Actinic keratosis: Secondary | ICD-10-CM | POA: Diagnosis not present

## 2018-12-30 DIAGNOSIS — K5190 Ulcerative colitis, unspecified, without complications: Secondary | ICD-10-CM | POA: Diagnosis not present

## 2018-12-30 DIAGNOSIS — Z79899 Other long term (current) drug therapy: Secondary | ICD-10-CM | POA: Diagnosis not present

## 2018-12-30 DIAGNOSIS — M2550 Pain in unspecified joint: Secondary | ICD-10-CM | POA: Diagnosis not present

## 2019-01-05 ENCOUNTER — Other Ambulatory Visit: Payer: Self-pay | Admitting: Family Medicine

## 2019-01-26 DIAGNOSIS — E559 Vitamin D deficiency, unspecified: Secondary | ICD-10-CM | POA: Diagnosis not present

## 2019-01-26 DIAGNOSIS — I10 Essential (primary) hypertension: Secondary | ICD-10-CM | POA: Diagnosis not present

## 2019-01-26 DIAGNOSIS — K219 Gastro-esophageal reflux disease without esophagitis: Secondary | ICD-10-CM | POA: Diagnosis not present

## 2019-01-26 DIAGNOSIS — E782 Mixed hyperlipidemia: Secondary | ICD-10-CM | POA: Diagnosis not present

## 2019-01-28 DIAGNOSIS — I10 Essential (primary) hypertension: Secondary | ICD-10-CM | POA: Diagnosis not present

## 2019-01-28 DIAGNOSIS — E782 Mixed hyperlipidemia: Secondary | ICD-10-CM | POA: Diagnosis not present

## 2019-01-28 DIAGNOSIS — Z1389 Encounter for screening for other disorder: Secondary | ICD-10-CM | POA: Diagnosis not present

## 2019-01-28 DIAGNOSIS — F331 Major depressive disorder, recurrent, moderate: Secondary | ICD-10-CM | POA: Diagnosis not present

## 2019-01-28 DIAGNOSIS — Z1331 Encounter for screening for depression: Secondary | ICD-10-CM | POA: Diagnosis not present

## 2019-01-28 DIAGNOSIS — K219 Gastro-esophageal reflux disease without esophagitis: Secondary | ICD-10-CM | POA: Diagnosis not present

## 2019-02-03 DIAGNOSIS — K5190 Ulcerative colitis, unspecified, without complications: Secondary | ICD-10-CM | POA: Diagnosis not present

## 2019-02-06 DIAGNOSIS — H25813 Combined forms of age-related cataract, bilateral: Secondary | ICD-10-CM | POA: Diagnosis not present

## 2019-02-09 DIAGNOSIS — Z79899 Other long term (current) drug therapy: Secondary | ICD-10-CM | POA: Diagnosis not present

## 2019-02-09 DIAGNOSIS — M4690 Unspecified inflammatory spondylopathy, site unspecified: Secondary | ICD-10-CM | POA: Diagnosis not present

## 2019-02-09 DIAGNOSIS — K5190 Ulcerative colitis, unspecified, without complications: Secondary | ICD-10-CM | POA: Diagnosis not present

## 2019-02-09 DIAGNOSIS — M1990 Unspecified osteoarthritis, unspecified site: Secondary | ICD-10-CM | POA: Diagnosis not present

## 2019-02-25 DIAGNOSIS — H25042 Posterior subcapsular polar age-related cataract, left eye: Secondary | ICD-10-CM | POA: Diagnosis not present

## 2019-02-25 DIAGNOSIS — H2511 Age-related nuclear cataract, right eye: Secondary | ICD-10-CM | POA: Diagnosis not present

## 2019-02-25 DIAGNOSIS — H2512 Age-related nuclear cataract, left eye: Secondary | ICD-10-CM | POA: Diagnosis not present

## 2019-02-25 DIAGNOSIS — H25041 Posterior subcapsular polar age-related cataract, right eye: Secondary | ICD-10-CM | POA: Diagnosis not present

## 2019-02-26 DIAGNOSIS — Z23 Encounter for immunization: Secondary | ICD-10-CM | POA: Diagnosis not present

## 2019-03-04 DIAGNOSIS — H2511 Age-related nuclear cataract, right eye: Secondary | ICD-10-CM | POA: Diagnosis not present

## 2019-03-04 DIAGNOSIS — H25041 Posterior subcapsular polar age-related cataract, right eye: Secondary | ICD-10-CM | POA: Diagnosis not present

## 2019-03-10 DIAGNOSIS — K5190 Ulcerative colitis, unspecified, without complications: Secondary | ICD-10-CM | POA: Diagnosis not present

## 2019-03-11 DIAGNOSIS — Z6828 Body mass index (BMI) 28.0-28.9, adult: Secondary | ICD-10-CM | POA: Diagnosis not present

## 2019-03-11 DIAGNOSIS — R300 Dysuria: Secondary | ICD-10-CM | POA: Diagnosis not present

## 2019-03-19 DIAGNOSIS — Z6828 Body mass index (BMI) 28.0-28.9, adult: Secondary | ICD-10-CM | POA: Diagnosis not present

## 2019-03-19 DIAGNOSIS — Z01419 Encounter for gynecological examination (general) (routine) without abnormal findings: Secondary | ICD-10-CM | POA: Diagnosis not present

## 2019-03-19 DIAGNOSIS — Z1231 Encounter for screening mammogram for malignant neoplasm of breast: Secondary | ICD-10-CM | POA: Diagnosis not present

## 2019-03-31 DIAGNOSIS — R309 Painful micturition, unspecified: Secondary | ICD-10-CM | POA: Diagnosis not present

## 2019-03-31 DIAGNOSIS — R3915 Urgency of urination: Secondary | ICD-10-CM | POA: Diagnosis not present

## 2019-03-31 DIAGNOSIS — N390 Urinary tract infection, site not specified: Secondary | ICD-10-CM | POA: Diagnosis not present

## 2019-04-23 DIAGNOSIS — K5190 Ulcerative colitis, unspecified, without complications: Secondary | ICD-10-CM | POA: Diagnosis not present

## 2019-05-05 DIAGNOSIS — R399 Unspecified symptoms and signs involving the genitourinary system: Secondary | ICD-10-CM | POA: Diagnosis not present

## 2019-05-05 DIAGNOSIS — R350 Frequency of micturition: Secondary | ICD-10-CM | POA: Diagnosis not present

## 2019-05-07 DIAGNOSIS — R102 Pelvic and perineal pain: Secondary | ICD-10-CM | POA: Diagnosis not present

## 2019-05-19 DIAGNOSIS — Z20822 Contact with and (suspected) exposure to covid-19: Secondary | ICD-10-CM | POA: Diagnosis not present

## 2019-05-27 DIAGNOSIS — F331 Major depressive disorder, recurrent, moderate: Secondary | ICD-10-CM | POA: Diagnosis not present

## 2019-05-27 DIAGNOSIS — I10 Essential (primary) hypertension: Secondary | ICD-10-CM | POA: Diagnosis not present

## 2019-05-27 DIAGNOSIS — K219 Gastro-esophageal reflux disease without esophagitis: Secondary | ICD-10-CM | POA: Diagnosis not present

## 2019-05-27 DIAGNOSIS — E782 Mixed hyperlipidemia: Secondary | ICD-10-CM | POA: Diagnosis not present

## 2019-06-01 DIAGNOSIS — K5190 Ulcerative colitis, unspecified, without complications: Secondary | ICD-10-CM | POA: Diagnosis not present

## 2019-06-15 DIAGNOSIS — M1990 Unspecified osteoarthritis, unspecified site: Secondary | ICD-10-CM | POA: Diagnosis not present

## 2019-06-15 DIAGNOSIS — K5190 Ulcerative colitis, unspecified, without complications: Secondary | ICD-10-CM | POA: Diagnosis not present

## 2019-06-15 DIAGNOSIS — M79645 Pain in left finger(s): Secondary | ICD-10-CM | POA: Diagnosis not present

## 2019-06-15 DIAGNOSIS — Z79899 Other long term (current) drug therapy: Secondary | ICD-10-CM | POA: Diagnosis not present

## 2019-06-15 DIAGNOSIS — M4690 Unspecified inflammatory spondylopathy, site unspecified: Secondary | ICD-10-CM | POA: Diagnosis not present

## 2019-07-03 DIAGNOSIS — M25532 Pain in left wrist: Secondary | ICD-10-CM | POA: Diagnosis not present

## 2019-07-03 DIAGNOSIS — M654 Radial styloid tenosynovitis [de Quervain]: Secondary | ICD-10-CM | POA: Diagnosis not present

## 2019-07-06 DIAGNOSIS — Z961 Presence of intraocular lens: Secondary | ICD-10-CM | POA: Diagnosis not present

## 2019-07-07 DIAGNOSIS — K5190 Ulcerative colitis, unspecified, without complications: Secondary | ICD-10-CM | POA: Diagnosis not present

## 2019-07-28 DIAGNOSIS — L280 Lichen simplex chronicus: Secondary | ICD-10-CM | POA: Diagnosis not present

## 2019-07-28 DIAGNOSIS — D485 Neoplasm of uncertain behavior of skin: Secondary | ICD-10-CM | POA: Diagnosis not present

## 2019-07-28 DIAGNOSIS — B078 Other viral warts: Secondary | ICD-10-CM | POA: Diagnosis not present

## 2019-07-28 DIAGNOSIS — L818 Other specified disorders of pigmentation: Secondary | ICD-10-CM | POA: Diagnosis not present

## 2019-07-28 DIAGNOSIS — L570 Actinic keratosis: Secondary | ICD-10-CM | POA: Diagnosis not present

## 2019-08-11 DIAGNOSIS — K5190 Ulcerative colitis, unspecified, without complications: Secondary | ICD-10-CM | POA: Diagnosis not present

## 2019-09-15 DIAGNOSIS — K5190 Ulcerative colitis, unspecified, without complications: Secondary | ICD-10-CM | POA: Diagnosis not present

## 2019-09-21 DIAGNOSIS — R109 Unspecified abdominal pain: Secondary | ICD-10-CM | POA: Diagnosis not present

## 2019-09-21 DIAGNOSIS — E782 Mixed hyperlipidemia: Secondary | ICD-10-CM | POA: Diagnosis not present

## 2019-09-21 DIAGNOSIS — Z0001 Encounter for general adult medical examination with abnormal findings: Secondary | ICD-10-CM | POA: Diagnosis not present

## 2019-09-21 DIAGNOSIS — R110 Nausea: Secondary | ICD-10-CM | POA: Diagnosis not present

## 2019-09-21 DIAGNOSIS — K219 Gastro-esophageal reflux disease without esophagitis: Secondary | ICD-10-CM | POA: Diagnosis not present

## 2019-09-21 DIAGNOSIS — I10 Essential (primary) hypertension: Secondary | ICD-10-CM | POA: Diagnosis not present

## 2019-09-23 DIAGNOSIS — F331 Major depressive disorder, recurrent, moderate: Secondary | ICD-10-CM | POA: Diagnosis not present

## 2019-09-23 DIAGNOSIS — E782 Mixed hyperlipidemia: Secondary | ICD-10-CM | POA: Diagnosis not present

## 2019-09-23 DIAGNOSIS — K219 Gastro-esophageal reflux disease without esophagitis: Secondary | ICD-10-CM | POA: Diagnosis not present

## 2019-09-23 DIAGNOSIS — I10 Essential (primary) hypertension: Secondary | ICD-10-CM | POA: Diagnosis not present

## 2019-09-30 ENCOUNTER — Ambulatory Visit: Payer: Self-pay | Attending: Internal Medicine

## 2019-09-30 DIAGNOSIS — Z23 Encounter for immunization: Secondary | ICD-10-CM

## 2019-09-30 NOTE — Progress Notes (Signed)
   Covid-19 Vaccination Clinic  Name:  Robin Gardner    MRN: [id number] DOB: 1966/03/06  09/30/2019  [name] was observed post Covid-19 immunization for 15 minutes without incident. She was provided with Vaccine Information Sheet and instruction to access the V-Safe system.   [name] was instructed to call 911 with any severe reactions post vaccine: Marland Kitchen Difficulty breathing  . Swelling of face and throat  . A fast heartbeat  . A bad rash all over body  . Dizziness and weakness   Immunizations Administered    Name Date Dose VIS Date Route   JANSSEN COVID-19 VACCINE 09/30/2019  1:28 PM 0.5 mL 07/04/2019 Intramuscular   Manufacturer: [employer]a [employer]   Lot: [id number]   [place]: [id number]

## 2019-10-01 DIAGNOSIS — K5190 Ulcerative colitis, unspecified, without complications: Secondary | ICD-10-CM | POA: Diagnosis not present

## 2019-10-01 DIAGNOSIS — M4690 Unspecified inflammatory spondylopathy, site unspecified: Secondary | ICD-10-CM | POA: Diagnosis not present

## 2019-10-01 DIAGNOSIS — Z79899 Other long term (current) drug therapy: Secondary | ICD-10-CM | POA: Diagnosis not present

## 2019-10-01 DIAGNOSIS — M79645 Pain in left finger(s): Secondary | ICD-10-CM | POA: Diagnosis not present

## 2019-10-08 DIAGNOSIS — N281 Cyst of kidney, acquired: Secondary | ICD-10-CM | POA: Diagnosis not present

## 2019-10-08 DIAGNOSIS — D734 Cyst of spleen: Secondary | ICD-10-CM | POA: Diagnosis not present

## 2019-10-08 DIAGNOSIS — K8020 Calculus of gallbladder without cholecystitis without obstruction: Secondary | ICD-10-CM | POA: Diagnosis not present

## 2019-10-08 DIAGNOSIS — R1011 Right upper quadrant pain: Secondary | ICD-10-CM | POA: Diagnosis not present

## 2019-10-20 DIAGNOSIS — K5190 Ulcerative colitis, unspecified, without complications: Secondary | ICD-10-CM | POA: Diagnosis not present

## 2019-11-10 DIAGNOSIS — K219 Gastro-esophageal reflux disease without esophagitis: Secondary | ICD-10-CM | POA: Diagnosis not present

## 2019-11-10 DIAGNOSIS — K5190 Ulcerative colitis, unspecified, without complications: Secondary | ICD-10-CM | POA: Diagnosis not present

## 2019-11-10 DIAGNOSIS — K8020 Calculus of gallbladder without cholecystitis without obstruction: Secondary | ICD-10-CM | POA: Diagnosis not present

## 2019-11-30 DIAGNOSIS — K5190 Ulcerative colitis, unspecified, without complications: Secondary | ICD-10-CM | POA: Diagnosis not present

## 2019-12-02 DIAGNOSIS — K8020 Calculus of gallbladder without cholecystitis without obstruction: Secondary | ICD-10-CM | POA: Diagnosis not present

## 2019-12-02 DIAGNOSIS — K219 Gastro-esophageal reflux disease without esophagitis: Secondary | ICD-10-CM | POA: Diagnosis not present

## 2019-12-02 DIAGNOSIS — K5130 Ulcerative (chronic) rectosigmoiditis without complications: Secondary | ICD-10-CM | POA: Diagnosis not present

## 2019-12-31 DIAGNOSIS — Z1159 Encounter for screening for other viral diseases: Secondary | ICD-10-CM | POA: Diagnosis not present

## 2020-01-05 DIAGNOSIS — R12 Heartburn: Secondary | ICD-10-CM | POA: Diagnosis not present

## 2020-01-05 DIAGNOSIS — K449 Diaphragmatic hernia without obstruction or gangrene: Secondary | ICD-10-CM | POA: Diagnosis not present

## 2020-01-05 DIAGNOSIS — K5130 Ulcerative (chronic) rectosigmoiditis without complications: Secondary | ICD-10-CM | POA: Diagnosis not present

## 2020-01-05 DIAGNOSIS — K5289 Other specified noninfective gastroenteritis and colitis: Secondary | ICD-10-CM | POA: Diagnosis not present

## 2020-01-07 DIAGNOSIS — Z79899 Other long term (current) drug therapy: Secondary | ICD-10-CM | POA: Diagnosis not present

## 2020-01-07 DIAGNOSIS — K5190 Ulcerative colitis, unspecified, without complications: Secondary | ICD-10-CM | POA: Diagnosis not present

## 2020-01-07 DIAGNOSIS — M4690 Unspecified inflammatory spondylopathy, site unspecified: Secondary | ICD-10-CM | POA: Diagnosis not present

## 2020-01-07 DIAGNOSIS — R5383 Other fatigue: Secondary | ICD-10-CM | POA: Diagnosis not present

## 2020-01-26 ENCOUNTER — Ambulatory Visit: Payer: Self-pay | Admitting: General Surgery

## 2020-01-28 DIAGNOSIS — R946 Abnormal results of thyroid function studies: Secondary | ICD-10-CM | POA: Diagnosis not present

## 2020-01-28 DIAGNOSIS — K219 Gastro-esophageal reflux disease without esophagitis: Secondary | ICD-10-CM | POA: Diagnosis not present

## 2020-01-28 DIAGNOSIS — I10 Essential (primary) hypertension: Secondary | ICD-10-CM | POA: Diagnosis not present

## 2020-01-28 DIAGNOSIS — E782 Mixed hyperlipidemia: Secondary | ICD-10-CM | POA: Diagnosis not present

## 2020-01-28 DIAGNOSIS — M069 Rheumatoid arthritis, unspecified: Secondary | ICD-10-CM | POA: Diagnosis not present

## 2020-02-02 DIAGNOSIS — M4690 Unspecified inflammatory spondylopathy, site unspecified: Secondary | ICD-10-CM | POA: Diagnosis not present

## 2020-02-02 DIAGNOSIS — F331 Major depressive disorder, recurrent, moderate: Secondary | ICD-10-CM | POA: Diagnosis not present

## 2020-02-02 DIAGNOSIS — I10 Essential (primary) hypertension: Secondary | ICD-10-CM | POA: Diagnosis not present

## 2020-02-02 DIAGNOSIS — J301 Allergic rhinitis due to pollen: Secondary | ICD-10-CM | POA: Diagnosis not present

## 2020-02-02 DIAGNOSIS — E782 Mixed hyperlipidemia: Secondary | ICD-10-CM | POA: Diagnosis not present

## 2020-02-02 DIAGNOSIS — K5190 Ulcerative colitis, unspecified, without complications: Secondary | ICD-10-CM | POA: Diagnosis not present

## 2020-02-02 DIAGNOSIS — M19049 Primary osteoarthritis, unspecified hand: Secondary | ICD-10-CM | POA: Diagnosis not present

## 2020-02-02 DIAGNOSIS — Z79899 Other long term (current) drug therapy: Secondary | ICD-10-CM | POA: Diagnosis not present

## 2020-02-16 DIAGNOSIS — K5190 Ulcerative colitis, unspecified, without complications: Secondary | ICD-10-CM | POA: Diagnosis not present

## 2020-03-09 NOTE — Patient Instructions (Addendum)
DUE TO COVID-19 ONLY ONE VISITOR IS ALLOWED TO COME WITH YOU AND STAY IN THE WAITING ROOM ONLY DURING PRE OP AND PROCEDURE DAY OF SURGERY. THE 1 VISITOR  MAY VISIT WITH YOU AFTER SURGERY IN YOUR PRIVATE ROOM DURING VISITING HOURS ONLY!  YOU NEED TO HAVE A COVID 19 TEST ON__11/15_____ @_10 :30______, THIS TEST MUST BE DONE BEFORE SURGERY,  COVID TESTING SITE [place] [id number], IT IS ON THE RIGHT GOING OUT [address] APPROXIMATELY  2 MINUTES PAST [employer] ON THE RIGHT. ONCE YOUR COVID TEST IS COMPLETED,  PLEASE BEGIN THE QUARANTINE INSTRUCTIONS AS OUTLINED IN YOUR HANDOUT.                Robin Gardner    Your procedure is scheduled on: 03/24/20   Report to [facility] Main  Entrance   Report to admitting at   7:30 AM     Call this number if you have problems the morning of surgery [phone]    Remember: Do not eat food or drink liquids :After Midnight  . BRUSH YOUR TEETH MORNING OF SURGERY AND RINSE YOUR MOUTH OUT, NO CHEWING GUM CANDY OR MINTS.     Take these medicines the morning of surgery with A SIP OF WATER: Zoloft, Amlodipine, Levothyroxine, Pantoprazole,Myrbetriq                                 You may not have any metal on your body including hair pins and              piercings  Do not wear jewelry, make-up, lotions, powders or perfumes, deodorant             Do not wear nail polish on your fingernails.  Do not shave  48 hours prior to surgery.  .   Do not bring valuables to the hospital. [place].  Contacts, dentures or bridgework may not be worn into surgery.      Patients discharged the day of surgery will not be allowed to drive home.   IF YOU ARE HAVING SURGERY AND GOING HOME THE SAME DAY, YOU MUST HAVE AN ADULT TO DRIVE YOU HOME AND BE WITH YOU FOR 24 HOURS.   YOU MAY GO HOME BY TAXI OR UBER OR ORTHERWISE, BUT AN ADULT MUST ACCOMPANY YOU HOME AND STAY WITH  YOU FOR 24 HOURS.  Name and phone number of your driver:  Special Instructions: N/A              Please read over the following fact sheets you were given: _____________________________________________________________________             [facility] - Preparing for Surgery Before surgery, you can play an important role.   Because skin is not sterile, your skin needs to be as free of germs as possible.   You can reduce the number of germs on your skin by washing with CHG (chlorahexidine gluconate) soap before surgery.   CHG is an antiseptic cleaner which kills germs and bonds with the skin to continue killing germs even after washing. Please DO NOT use if you have an allergy to CHG or antibacterial soaps.   If your skin becomes reddened/irritated stop using the CHG and inform your nurse when you arrive at Short  Stay. Do not shave (including legs and underarms) for at least 48 hours prior to the first CHG shower.  . Please follow these instructions carefully:  1.  Shower with CHG Soap the night before surgery and the  morning of Surgery.  2.  If you choose to wash your hair, wash your hair first as usual with your  normal  shampoo.  3.  After you shampoo, rinse your hair and body thoroughly to remove the  shampoo.                                        4.  Use CHG as you would any other liquid soap.  You can apply chg directly  to the skin and wash                       Gently with a scrungie or clean washcloth.  5.  Apply the CHG Soap to your body ONLY FROM THE NECK DOWN.   Do not use on face/ open                           Wound or open sores. Avoid contact with eyes, ears mouth and genitals (private parts).                       Wash face,  Genitals (private parts) with your normal soap.             6.  Wash thoroughly, paying special attention to the area where your surgery  will be performed.  7.  Thoroughly rinse your body with warm water from the neck down.  8.  DO NOT shower/wash  with your normal soap after using and rinsing off  the CHG Soap.             9.  Pat yourself dry with a clean towel.            10.  Wear clean pajamas.            11.  Place clean sheets on your bed the night of your first shower and do not  sleep with pets. Day of Surgery : Do not apply any lotions/deodorants the morning of surgery.  Please wear clean clothes to the hospital/surgery center.  FAILURE TO FOLLOW THESE INSTRUCTIONS MAY RESULT IN THE CANCELLATION OF YOUR SURGERY PATIENT SIGNATURE_________________________________  NURSE SIGNATURE__________________________________  ________________________________________________________________________

## 2020-03-11 ENCOUNTER — Encounter (HOSPITAL_COMMUNITY)
Admission: RE | Admit: 2020-03-11 | Discharge: 2020-03-11 | Disposition: A | Discharge status: Home / Self Care | Payer: BC Managed Care – PPO | Source: Ambulatory Visit | Attending: General Surgery | Admitting: General Surgery

## 2020-03-11 ENCOUNTER — Other Ambulatory Visit: Payer: Self-pay

## 2020-03-11 ENCOUNTER — Encounter (HOSPITAL_COMMUNITY): Payer: Self-pay

## 2020-03-11 DIAGNOSIS — Z01812 Encounter for preprocedural laboratory examination: Secondary | ICD-10-CM | POA: Diagnosis not present

## 2020-03-11 HISTORY — DX: Unspecified osteoarthritis, unspecified site: M19.90

## 2020-03-11 LAB — COMPREHENSIVE METABOLIC PANEL
ALT: 29 U/L (ref 0–44)
AST: 28 U/L (ref 15–41)
Albumin: 4.4 g/dL (ref 3.5–5.0)
Alkaline Phosphatase: 66 U/L (ref 38–126)
Anion gap: 11 (ref 5–15)
BUN: 15 mg/dL (ref 6–20)
CO2: 27 mmol/L (ref 22–32)
Calcium: 9.1 mg/dL (ref 8.9–10.3)
Chloride: 103 mmol/L (ref 98–111)
Creatinine, Ser: 0.84 mg/dL (ref 0.44–1.00)
GFR, Estimated: >60 mL/min (ref >60)
Glucose, Bld: 99 mg/dL (ref 70–99)
Potassium: 4.2 mmol/L (ref 3.5–5.1)
Sodium: 141 mmol/L (ref 135–145)
Total Bilirubin: 0.7 mg/dL (ref 0.3–1.2)
Total Protein: 8 g/dL (ref 6.5–8.1)

## 2020-03-11 LAB — CBC
HCT: 42.7 % (ref 36.0–46.0)
Hemoglobin: 13.7 g/dL (ref 12.0–15.0)
MCH: 30.4 pg (ref 26.0–34.0)
MCHC: 32.1 g/dL (ref 30.0–36.0)
MCV: 94.9 fL (ref 80.0–100.0)
Platelets: 269 10*3/uL (ref 150–400)
RBC: 4.5 MIL/uL (ref 3.87–5.11)
RDW: 14.2 % (ref 11.5–15.5)
WBC: 6.5 10*3/uL (ref 4.0–10.5)
nRBC: 0 % (ref 0.0–0.2)

## 2020-03-11 NOTE — Progress Notes (Signed)
COVID Vaccine Completed:Yes Date COVID Vaccine completed:09/30/19 COVID vaccine manufacturer:   [employer]   PCP - Dr. Dub Amis Cardiologist - none Rheumatologist- Sabino Niemann, [place].  Chest x-ray - no EKG - 03/11/20 Stress Test - no ECHO - no Cardiac Cath - no Pacemaker/ICD device last checked:NA  Sleep Study - no CPAP -   Fasting Blood Sugar - NA Checks Blood Sugar _____ times a day  Blood Thinner Instructions:NA Aspirin Instructions: Last Dose:  Anesthesia review:   Patient denies shortness of breath, fever, cough and chest pain at PAT appointment yes   Patient verbalized understanding of instructions that were given to them at the PAT appointment. Patient was also instructed that they will need to review over the PAT instructions again at home before surgery. Yes Pt has no SOB with activity

## 2020-03-21 ENCOUNTER — Other Ambulatory Visit (HOSPITAL_COMMUNITY)
Admission: RE | Admit: 2020-03-21 | Discharge: 2020-03-21 | Disposition: A | Discharge status: Home / Self Care | Payer: BC Managed Care – PPO | Source: Ambulatory Visit | Attending: General Surgery | Admitting: General Surgery

## 2020-03-21 DIAGNOSIS — Z20822 Contact with and (suspected) exposure to covid-19: Secondary | ICD-10-CM | POA: Insufficient documentation

## 2020-03-21 DIAGNOSIS — K8010 Calculus of gallbladder with chronic cholecystitis without obstruction: Secondary | ICD-10-CM | POA: Diagnosis not present

## 2020-03-21 DIAGNOSIS — Z886 Allergy status to analgesic agent status: Secondary | ICD-10-CM | POA: Diagnosis not present

## 2020-03-21 DIAGNOSIS — Z01812 Encounter for preprocedural laboratory examination: Secondary | ICD-10-CM | POA: Insufficient documentation

## 2020-03-21 DIAGNOSIS — Z79899 Other long term (current) drug therapy: Secondary | ICD-10-CM | POA: Diagnosis not present

## 2020-03-21 DIAGNOSIS — K5190 Ulcerative colitis, unspecified, without complications: Secondary | ICD-10-CM | POA: Diagnosis not present

## 2020-03-21 DIAGNOSIS — Z881 Allergy status to other antibiotic agents status: Secondary | ICD-10-CM | POA: Diagnosis not present

## 2020-03-21 DIAGNOSIS — Z880 Allergy status to penicillin: Secondary | ICD-10-CM | POA: Diagnosis not present

## 2020-03-21 DIAGNOSIS — K811 Chronic cholecystitis: Secondary | ICD-10-CM | POA: Diagnosis not present

## 2020-03-21 DIAGNOSIS — K219 Gastro-esophageal reflux disease without esophagitis: Secondary | ICD-10-CM | POA: Diagnosis not present

## 2020-03-21 DIAGNOSIS — Z888 Allergy status to other drugs, medicaments and biological substances status: Secondary | ICD-10-CM | POA: Diagnosis not present

## 2020-03-21 LAB — SARS CORONAVIRUS 2 (TAT 6-24 HRS): SARS Coronavirus 2: NEGATIVE

## 2020-03-24 ENCOUNTER — Other Ambulatory Visit: Payer: Self-pay

## 2020-03-24 ENCOUNTER — Ambulatory Visit (HOSPITAL_COMMUNITY): Payer: BC Managed Care – PPO

## 2020-03-24 ENCOUNTER — Ambulatory Visit (HOSPITAL_COMMUNITY)
Admission: RE | Admit: 2020-03-24 | Discharge: 2020-03-24 | Disposition: A | Discharge status: Home / Self Care | Payer: BC Managed Care – PPO | Source: Ambulatory Visit | Attending: General Surgery | Admitting: General Surgery

## 2020-03-24 ENCOUNTER — Encounter (HOSPITAL_COMMUNITY)
Admission: RE | Disposition: A | Discharge status: Home / Self Care | Payer: Self-pay | Source: Ambulatory Visit | Attending: General Surgery

## 2020-03-24 ENCOUNTER — Encounter (HOSPITAL_COMMUNITY): Payer: Self-pay | Admitting: General Surgery

## 2020-03-24 ENCOUNTER — Ambulatory Visit (HOSPITAL_COMMUNITY): Payer: BC Managed Care – PPO | Admitting: Anesthesiology

## 2020-03-24 DIAGNOSIS — Z888 Allergy status to other drugs, medicaments and biological substances status: Secondary | ICD-10-CM | POA: Diagnosis not present

## 2020-03-24 DIAGNOSIS — K811 Chronic cholecystitis: Secondary | ICD-10-CM | POA: Diagnosis not present

## 2020-03-24 DIAGNOSIS — E039 Hypothyroidism, unspecified: Secondary | ICD-10-CM | POA: Diagnosis not present

## 2020-03-24 DIAGNOSIS — K5190 Ulcerative colitis, unspecified, without complications: Secondary | ICD-10-CM | POA: Diagnosis not present

## 2020-03-24 DIAGNOSIS — K219 Gastro-esophageal reflux disease without esophagitis: Secondary | ICD-10-CM | POA: Insufficient documentation

## 2020-03-24 DIAGNOSIS — Z20822 Contact with and (suspected) exposure to covid-19: Secondary | ICD-10-CM | POA: Insufficient documentation

## 2020-03-24 DIAGNOSIS — Z79899 Other long term (current) drug therapy: Secondary | ICD-10-CM | POA: Diagnosis not present

## 2020-03-24 DIAGNOSIS — Z0389 Encounter for observation for other suspected diseases and conditions ruled out: Secondary | ICD-10-CM | POA: Diagnosis not present

## 2020-03-24 DIAGNOSIS — Z880 Allergy status to penicillin: Secondary | ICD-10-CM | POA: Insufficient documentation

## 2020-03-24 DIAGNOSIS — Z881 Allergy status to other antibiotic agents status: Secondary | ICD-10-CM | POA: Insufficient documentation

## 2020-03-24 DIAGNOSIS — Z886 Allergy status to analgesic agent status: Secondary | ICD-10-CM | POA: Diagnosis not present

## 2020-03-24 DIAGNOSIS — K8010 Calculus of gallbladder with chronic cholecystitis without obstruction: Secondary | ICD-10-CM | POA: Insufficient documentation

## 2020-03-24 DIAGNOSIS — I10 Essential (primary) hypertension: Secondary | ICD-10-CM | POA: Diagnosis not present

## 2020-03-24 DIAGNOSIS — Z419 Encounter for procedure for purposes other than remedying health state, unspecified: Secondary | ICD-10-CM

## 2020-03-24 HISTORY — PX: CHOLECYSTECTOMY: SHX55

## 2020-03-24 SURGERY — LAPAROSCOPIC CHOLECYSTECTOMY WITH INTRAOPERATIVE CHOLANGIOGRAM
Anesthesia: General

## 2020-03-24 MED ORDER — BUPIVACAINE-EPINEPHRINE (PF) 0.25% -1:200000 IJ SOLN
INTRAMUSCULAR | Status: AC
  Filled 2020-03-24: qty 30

## 2020-03-24 MED ORDER — OXYCODONE HCL 5 MG PO TABS: 5.0000 mg | ORAL_TABLET | Freq: Once | ORAL | Status: DC | PRN

## 2020-03-24 MED ORDER — FENTANYL CITRATE (PF) 100 MCG/2ML IJ SOLN
25.0000 ug | INTRAMUSCULAR | Status: DC | PRN
  Administered 2020-03-24: 25 ug via INTRAVENOUS

## 2020-03-24 MED ORDER — BUPIVACAINE-EPINEPHRINE 0.25% -1:200000 IJ SOLN
INTRAMUSCULAR | Status: DC | PRN
  Administered 2020-03-24: 30 mL
  Administered 2020-03-24: 20 mL

## 2020-03-24 MED ORDER — ONDANSETRON HCL 4 MG/2ML IJ SOLN: 4.0000 mg | Freq: Once | INTRAMUSCULAR | Status: DC | PRN

## 2020-03-24 MED ORDER — CIPROFLOXACIN IN D5W 400 MG/200ML IV SOLN
400.0000 mg | INTRAVENOUS | Status: AC
  Administered 2020-03-24: 400 mg via INTRAVENOUS
  Filled 2020-03-24: qty 200

## 2020-03-24 MED ORDER — FENTANYL CITRATE (PF) 100 MCG/2ML IJ SOLN
INTRAMUSCULAR | Status: DC | PRN
  Administered 2020-03-24: 50 ug via INTRAVENOUS

## 2020-03-24 MED ORDER — LIDOCAINE 2% (20 MG/ML) 5 ML SYRINGE
INTRAMUSCULAR | Status: DC | PRN
  Administered 2020-03-24: 60 mg via INTRAVENOUS

## 2020-03-24 MED ORDER — SODIUM CHLORIDE 0.9 % IV SOLN
INTRAVENOUS | Status: DC | PRN
  Administered 2020-03-24: 6 mL

## 2020-03-24 MED ORDER — OXYCODONE HCL 5 MG/5ML PO SOLN: 5.0000 mg | Freq: Once | ORAL | Status: DC | PRN

## 2020-03-24 MED ORDER — ROCURONIUM BROMIDE 10 MG/ML (PF) SYRINGE
PREFILLED_SYRINGE | INTRAVENOUS | Status: DC | PRN
  Administered 2020-03-24: 50 mg via INTRAVENOUS

## 2020-03-24 MED ORDER — ONDANSETRON HCL 4 MG/2ML IJ SOLN
INTRAMUSCULAR | Status: DC | PRN
  Administered 2020-03-24: 4 mg via INTRAVENOUS

## 2020-03-24 MED ORDER — PROPOFOL 10 MG/ML IV BOLUS
INTRAVENOUS | Status: DC | PRN
  Administered 2020-03-24: 150 mg via INTRAVENOUS

## 2020-03-24 MED ORDER — LIDOCAINE 2% (20 MG/ML) 5 ML SYRINGE
INTRAMUSCULAR | Status: AC
  Filled 2020-03-24: qty 5

## 2020-03-24 MED ORDER — DIPHENHYDRAMINE HCL 50 MG/ML IJ SOLN
INTRAMUSCULAR | Status: AC
  Filled 2020-03-24: qty 1

## 2020-03-24 MED ORDER — LACTATED RINGERS IV SOLN: INTRAVENOUS | Status: DC

## 2020-03-24 MED ORDER — ACETAMINOPHEN 160 MG/5ML PO SOLN: 325.0000 mg | ORAL | Status: DC | PRN

## 2020-03-24 MED ORDER — FENTANYL CITRATE (PF) 100 MCG/2ML IJ SOLN
INTRAMUSCULAR | Status: AC
  Administered 2020-03-24: 50 ug via INTRAVENOUS
  Filled 2020-03-24: qty 2

## 2020-03-24 MED ORDER — CHLORHEXIDINE GLUCONATE CLOTH 2 % EX PADS: 6.0000 | MEDICATED_PAD | Freq: Once | CUTANEOUS | Status: DC

## 2020-03-24 MED ORDER — ORAL CARE MOUTH RINSE: 15.0000 mL | Freq: Once | OROMUCOSAL | Status: AC

## 2020-03-24 MED ORDER — BUPIVACAINE-EPINEPHRINE 0.5% -1:200000 IJ SOLN
INTRAMUSCULAR | Status: AC
  Filled 2020-03-24: qty 1

## 2020-03-24 MED ORDER — FENTANYL CITRATE (PF) 250 MCG/5ML IJ SOLN
INTRAMUSCULAR | Status: AC
  Filled 2020-03-24: qty 5

## 2020-03-24 MED ORDER — ACETAMINOPHEN 500 MG PO TABS: 1000.0000 mg | ORAL_TABLET | Freq: Three times a day (TID) | ORAL | 0 refills | Status: AC

## 2020-03-24 MED ORDER — ACETAMINOPHEN 500 MG PO TABS
1000.0000 mg | ORAL_TABLET | ORAL | Status: AC
  Administered 2020-03-24: 1000 mg via ORAL
  Filled 2020-03-24: qty 2

## 2020-03-24 MED ORDER — DEXAMETHASONE SODIUM PHOSPHATE 10 MG/ML IJ SOLN
INTRAMUSCULAR | Status: AC
  Filled 2020-03-24: qty 1

## 2020-03-24 MED ORDER — CHLORHEXIDINE GLUCONATE 0.12 % MT SOLN
15.0000 mL | Freq: Once | OROMUCOSAL | Status: AC
  Administered 2020-03-24: 15 mL via OROMUCOSAL

## 2020-03-24 MED ORDER — LACTATED RINGERS IV SOLN
INTRAVENOUS | Status: DC | PRN
  Administered 2020-03-24: 1000 mL via INTRAVENOUS

## 2020-03-24 MED ORDER — DEXAMETHASONE SODIUM PHOSPHATE 4 MG/ML IJ SOLN
INTRAMUSCULAR | Status: DC | PRN
  Administered 2020-03-24: 10 mg via INTRAVENOUS

## 2020-03-24 MED ORDER — MIDAZOLAM HCL 5 MG/5ML IJ SOLN
INTRAMUSCULAR | Status: DC | PRN
  Administered 2020-03-24: 2 mg via INTRAVENOUS

## 2020-03-24 MED ORDER — EPHEDRINE SULFATE-NACL 50-0.9 MG/10ML-% IV SOSY
PREFILLED_SYRINGE | INTRAVENOUS | Status: DC | PRN
  Administered 2020-03-24 (×4): 10 mg via INTRAVENOUS

## 2020-03-24 MED ORDER — SUGAMMADEX SODIUM 200 MG/2ML IV SOLN
INTRAVENOUS | Status: DC | PRN
  Administered 2020-03-24: 200 mg via INTRAVENOUS

## 2020-03-24 MED ORDER — ACETAMINOPHEN 325 MG PO TABS: 325.0000 mg | ORAL_TABLET | ORAL | Status: DC | PRN

## 2020-03-24 MED ORDER — MIDAZOLAM HCL 2 MG/2ML IJ SOLN
INTRAMUSCULAR | Status: AC
  Filled 2020-03-24: qty 2

## 2020-03-24 MED ORDER — MEPERIDINE HCL 50 MG/ML IJ SOLN: 6.2500 mg | INTRAMUSCULAR | Status: DC | PRN

## 2020-03-24 MED ORDER — KETAMINE HCL 10 MG/ML IJ SOLN
INTRAMUSCULAR | Status: DC | PRN
  Administered 2020-03-24: 40 mg via INTRAVENOUS

## 2020-03-24 MED ORDER — OXYCODONE HCL 5 MG PO TABS: 5.0000 mg | ORAL_TABLET | Freq: Four times a day (QID) | ORAL | 0 refills | Status: DC | PRN

## 2020-03-24 MED ORDER — ONDANSETRON HCL 4 MG/2ML IJ SOLN
INTRAMUSCULAR | Status: AC
  Filled 2020-03-24: qty 2

## 2020-03-24 MED ORDER — ROCURONIUM BROMIDE 10 MG/ML (PF) SYRINGE
PREFILLED_SYRINGE | INTRAVENOUS | Status: AC
  Filled 2020-03-24: qty 10

## 2020-03-24 SURGICAL SUPPLY — 60 items
ADH SKN CLS APL DERMABOND .7 (GAUZE/BANDAGES/DRESSINGS)
APL PRP STRL LF DISP 70% ISPRP (MISCELLANEOUS) ×1
APL SKNCLS STERI-STRIP NONHPOA (GAUZE/BANDAGES/DRESSINGS)
APL SRG 38 LTWT LNG FL B (MISCELLANEOUS)
APPLICATOR ARISTA FLEXITIP XL (MISCELLANEOUS) IMPLANT
APPLIER CLIP 5 13 M/L LIGAMAX5 (MISCELLANEOUS)
APPLIER CLIP ROT 10 11.4 M/L (STAPLE)
APR CLP MED LRG 11.4X10 (STAPLE)
APR CLP MED LRG 5 ANG JAW (MISCELLANEOUS)
BAG SPEC RTRVL 10 TROC 200 (ENDOMECHANICALS) ×1
BENZOIN TINCTURE PRP APPL 2/3 (GAUZE/BANDAGES/DRESSINGS) IMPLANT
BNDG ADH 1X3 SHEER STRL LF (GAUZE/BANDAGES/DRESSINGS) ×8 IMPLANT
BNDG ADH THN 3X1 STRL LF (GAUZE/BANDAGES/DRESSINGS) ×4
CABLE HIGH FREQUENCY MONO STRZ (ELECTRODE) ×2 IMPLANT
CHLORAPREP W/TINT 26 (MISCELLANEOUS) ×2 IMPLANT
CLIP APPLIE 5 13 M/L LIGAMAX5 (MISCELLANEOUS) IMPLANT
CLIP APPLIE ROT 10 11.4 M/L (STAPLE) IMPLANT
CLIP VESOLOCK MED LG 6/CT (CLIP) IMPLANT
COVER MAYO STAND STRL (DRAPES) ×1 IMPLANT
COVER SURGICAL LIGHT HANDLE (MISCELLANEOUS) ×2 IMPLANT
COVER WAND RF STERILE (DRAPES) IMPLANT
DECANTER SPIKE VIAL GLASS SM (MISCELLANEOUS) ×2 IMPLANT
DERMABOND ADVANCED (GAUZE/BANDAGES/DRESSINGS)
DERMABOND ADVANCED .7 DNX12 (GAUZE/BANDAGES/DRESSINGS) IMPLANT
DRAPE C-ARM 42X120 X-RAY (DRAPES) ×1 IMPLANT
DRSG TEGADERM 2-3/8X2-3/4 SM (GAUZE/BANDAGES/DRESSINGS) ×1 IMPLANT
ELECT REM PT RETURN 15FT ADLT (MISCELLANEOUS) ×2 IMPLANT
GAUZE SPONGE 2X2 8PLY STRL LF (GAUZE/BANDAGES/DRESSINGS) IMPLANT
GLOVE BIO SURGEON STRL SZ7.5 (GLOVE) ×2 IMPLANT
GLOVE INDICATOR 8.0 STRL GRN (GLOVE) ×2 IMPLANT
GOWN STRL REUS W/TWL XL LVL3 (GOWN DISPOSABLE) ×6 IMPLANT
GRASPER SUT TROCAR 14GX15 (MISCELLANEOUS) ×1 IMPLANT
HEMOSTAT ARISTA ABSORB 3G PWDR (HEMOSTASIS) IMPLANT
HEMOSTAT SNOW SURGICEL 2X4 (HEMOSTASIS) IMPLANT
KIT BASIN OR (CUSTOM PROCEDURE TRAY) ×2 IMPLANT
KIT TURNOVER KIT A (KITS) IMPLANT
L-HOOK LAP DISP 36CM (ELECTROSURGICAL)
LHOOK LAP DISP 36CM (ELECTROSURGICAL) IMPLANT
POUCH RETRIEVAL ECOSAC 10 (ENDOMECHANICALS) ×1 IMPLANT
POUCH RETRIEVAL ECOSAC 10MM (ENDOMECHANICALS) ×2
SCISSORS LAP 5X35 DISP (ENDOMECHANICALS) ×2 IMPLANT
SET CHOLANGIOGRAPH MIX (MISCELLANEOUS) ×1 IMPLANT
SET IRRIG TUBING LAPAROSCOPIC (IRRIGATION / IRRIGATOR) ×2 IMPLANT
SET TUBE SMOKE EVAC HIGH FLOW (TUBING) ×2 IMPLANT
SLEEVE XCEL OPT CAN 5 100 (ENDOMECHANICALS) ×4 IMPLANT
SPONGE GAUZE 2X2 8PLY STRL LF (GAUZE/BANDAGES/DRESSINGS) ×1 IMPLANT
SPONGE GAUZE 2X2 STER 10/PKG (GAUZE/BANDAGES/DRESSINGS)
STRIP CLOSURE SKIN 1/2X4 (GAUZE/BANDAGES/DRESSINGS) IMPLANT
SUT MNCRL AB 4-0 PS2 18 (SUTURE) ×2 IMPLANT
SUT VIC AB 0 UR5 27 (SUTURE) ×1 IMPLANT
SUT VICRYL 0 TIES 12 18 (SUTURE) IMPLANT
SUT VICRYL 0 UR6 27IN ABS (SUTURE) IMPLANT
SYR 30ML LL (SYRINGE) ×1 IMPLANT
TAPE STRIPS DRAPE STRL (GAUZE/BANDAGES/DRESSINGS) ×1 IMPLANT
TOWEL OR 17X26 10 PK STRL BLUE (TOWEL DISPOSABLE) ×2 IMPLANT
TOWEL OR NON WOVEN STRL DISP B (DISPOSABLE) ×2 IMPLANT
TRAY LAPAROSCOPIC (CUSTOM PROCEDURE TRAY) ×2 IMPLANT
TROCAR BLADELESS OPT 5 100 (ENDOMECHANICALS) ×2 IMPLANT
TROCAR XCEL BLUNT TIP 100MML (ENDOMECHANICALS) IMPLANT
TROCAR XCEL NON-BLD 11X100MML (ENDOMECHANICALS) IMPLANT

## 2020-03-24 NOTE — Op Note (Signed)
Laparoscopic Cholecystectomy with IOC Procedure Note  Indications: This patient presents with symptomatic gallbladder disease and will undergo laparoscopic cholecystectomy.  Pre-operative Diagnosis: Chronic cholecystitis  Post-operative Diagnosis: Same  Surgeon: Greer Pickerel, MD  Assistants: Sheria Lang, MD  Anesthesia: General endotracheal anesthesia  ASA Class: 2  Procedure Details  The patient was seen again in the Holding Room. The risks, benefits, complications, treatment options, and expected outcomes were discussed with the patient. The possibilities of reaction to medication, pulmonary aspiration, perforation of viscus, bleeding, recurrent infection, finding a normal gallbladder, the need for additional procedures, failure to diagnose a condition, the possible need to convert to an open procedure, and creating a complication requiring transfusion or operation were discussed with the patient. The likelihood of improving the patient's symptoms with return to their baseline status is good.  The patient and/or family concurred with the proposed plan, giving informed consent. The site of surgery properly noted. The patient was taken to Operating Room, identified as Robin Gardner and the procedure verified as Laparoscopic Cholecystectomy with Intraoperative Cholangiogram. A Time Out was held and the above information confirmed.  Prior to the induction of general anesthesia, antibiotic prophylaxis was administered. General endotracheal anesthesia was then administered and tolerated well. After the induction, the abdomen was prepped with Chloraprep and draped in the sterile fashion. The patient was positioned in the supine position.  Local anesthetic agent was injected into the skin near the umbilicus and an incision made. We dissected down to the abdominal fascia with blunt dissection.  The fascia was incised vertically and we entered the peritoneal cavity bluntly.  A pursestring suture of  0-Vicryl was placed around the fascial opening.  The Hasson cannula was inserted and secured with the stay suture.  Pneumoperitoneum was then created with CO2 and tolerated well without any adverse changes in the patient's vital signs. A 5-mm port was placed in the subxiphoid position.  Two 5-mm ports were placed in the right upper quadrant. All skin incisions were infiltrated with a local anesthetic agent before making the incision and placing the trocars.   We positioned the patient in reverse Trendelenburg, tilted slightly to the patient's left.  The gallbladder was identified, the fundus grasped and retracted cephalad. Adhesions were lysed bluntly and with the electrocautery where indicated, taking care not to injure any adjacent organs or viscus. The infundibulum was grasped and retracted laterally, exposing the peritoneum overlying the triangle of Calot. This was then divided and exposed. A critical view of the cystic duct and cystic artery was obtained.  The cystic duct was clearly identified and dissected circumferentially. The cystic duct was ligated with a clip distally.   An incision was made in the cystic duct and the [facility] cholangiogram catheter introduced. The catheter was secured using a clip. A cholangiogram was then obtained which showed good visualization of the distal and proximal biliary tree with no sign of filling defects or obstruction.  Contrast flowed easily into the duodenum. The catheter was then removed.   The cystic duct was then ligated with clips and divided. The cystic artery was identified, dissected free, ligated with clips and divided as well.   The gallbladder was dissected from the liver bed in retrograde fashion with the electrocautery. The gallbladder was removed and placed in an Eccosac bag. The liver bed was irrigated and inspected. Hemostasis was achieved with the electrocautery. The gallbladder and Eccosac bag were then removed through the umbilical port site.  The  pursestring suture was used to close  the umbilical fascia. An additional 0 Vicryl sutures was used to close the fascial defect at the umbilical with the aid of a laparoscopic suture passing device.   We again inspected the right upper quadrant for hemostasis.  Pneumoperitoneum was released as we removed the trocars.  4-0 Monocryl was used to close the skin.  Surgical glue was applied. The patient was then extubated and brought to the recovery room in stable condition. Instrument, sponge, and needle counts were correct at closure and at the conclusion of the case.   Findings: Cholecystitis with Cholelithiasis  Estimated Blood Loss: Minimal         Drains: None         Specimens: Gallbladder           Complications: None; patient tolerated the procedure well.         Disposition: PACU - hemodynamically stable.         Condition: stable

## 2020-03-24 NOTE — Anesthesia Preprocedure Evaluation (Signed)
Anesthesia Evaluation  Patient identified by MRN, date of birth, ID band Patient awake    Reviewed: Allergy & Precautions, NPO status   Airway Mallampati: II  TM Distance: >3 FB     Dental   Pulmonary    breath sounds clear to auscultation       Cardiovascular hypertension,  Rhythm:Regular Rate:Normal     Neuro/Psych Anxiety Depression  Neuromuscular disease    GI/Hepatic Neg liver ROS, PUD, GERD  ,  Endo/Other  Hypothyroidism   Renal/GU negative Renal ROS     Musculoskeletal   Abdominal   Peds  Hematology   Anesthesia Other Findings   Reproductive/Obstetrics                             Anesthesia Physical Anesthesia Plan  ASA: III  Anesthesia Plan: General   Post-op Pain Management:    Induction: Intravenous  PONV Risk Score and Plan: 3 and Ondansetron, Dexamethasone and Midazolam  Airway Management Planned: Oral ETT  Additional Equipment:   Intra-op Plan:   Post-operative Plan: Extubation in OR  Informed Consent:     Dental advisory given  Plan Discussed with: CRNA and Anesthesiologist  Anesthesia Plan Comments:         Anesthesia Quick Evaluation

## 2020-03-24 NOTE — Anesthesia Postprocedure Evaluation (Signed)
Anesthesia Post Note  Patient: Robin Gardner  Procedure(s) Performed: LAPAROSCOPIC CHOLECYSTECTOMY WITH INTRAOPERATIVE CHOLANGIOGRAM (N/A )     Patient location during evaluation: PACU Anesthesia Type: General Level of consciousness: awake Pain management: pain level controlled Vital Signs Assessment: post-procedure vital signs reviewed and stable Respiratory status: spontaneous breathing Cardiovascular status: stable Postop Assessment: no apparent nausea or vomiting Anesthetic complications: no   No complications documented.  Last Vitals:  Vitals:   03/24/20 1115 03/24/20 1130  BP: 134/72 133/69  Pulse: 73 77  Resp: (!) 28 18  Temp:    SpO2: 98% 94%    Last Pain:  Vitals:   03/24/20 1130  TempSrc:   PainSc: Asleep                 Mohini Heathcock

## 2020-03-24 NOTE — Transfer of Care (Signed)
Immediate Anesthesia Transfer of Care Note  Patient: Robin Gardner  Procedure(s) Performed: LAPAROSCOPIC CHOLECYSTECTOMY WITH INTRAOPERATIVE CHOLANGIOGRAM (N/A )  Patient Location: PACU  Anesthesia Type:General  Level of Consciousness: sedated  Airway & Oxygen Therapy: Patient Spontanous Breathing and Patient connected to face mask oxygen  Post-op Assessment: Report given to RN and Post -op Vital signs reviewed and stable  Post vital signs: Reviewed and stable  Last Vitals:  Vitals Value Taken Time  BP 142/73 03/24/20 1047  Temp    Pulse 75 03/24/20 1047  Resp 22 03/24/20 1047  SpO2 98 % 03/24/20 1047  Vitals shown include unvalidated device data.  Last Pain:  Vitals:   03/24/20 0806  TempSrc: Oral  PainSc:       Patients Stated Pain Goal: 3 (06/84/03 3533)  Complications: No complications documented.

## 2020-03-24 NOTE — Anesthesia Procedure Notes (Signed)
Procedure Name: Intubation Date/Time: 03/24/2020 9:33 AM Performed by: Lieutenant Diego, CRNA Pre-anesthesia Checklist: Patient identified, Emergency Drugs available, Suction available and Patient being monitored Patient Re-evaluated:Patient Re-evaluated prior to induction Oxygen Delivery Method: Circle system utilized Preoxygenation: Pre-oxygenation with 100% oxygen Induction Type: IV induction Ventilation: Mask ventilation without difficulty Laryngoscope Size: Miller and 2 Grade View: Grade I Tube type: Oral Tube size: 7.0 mm Number of attempts: 1 Airway Equipment and Method: Stylet Placement Confirmation: ETT inserted through vocal cords under direct vision,  positive ETCO2 and breath sounds checked- equal and bilateral Secured at: 23 cm Tube secured with: Tape Dental Injury: Teeth and Oropharynx as per pre-operative assessment

## 2020-03-24 NOTE — Discharge Instructions (Signed)
[facility] °LAPAROSCOPIC SURGERY: POST OP INSTRUCTIONS °Always review your discharge instruction sheet given to you by the facility where your surgery was performed. °IF YOU HAVE DISABILITY OR FAMILY LEAVE FORMS, YOU MUST BRING THEM TO THE OFFICE FOR PROCESSING.   °DO NOT GIVE THEM TO YOUR DOCTOR. ° °PAIN CONTROL ° °1. First take acetaminophen (Tylenol) AND/or ibuprofen (Advil) to control your pain after surgery.  Follow directions on package.  Taking acetaminophen (Tylenol) and/or ibuprofen (Advil) regularly after surgery will help to control your pain and lower the amount of prescription pain medication you may need.  You should not take more than 3,000 mg (3 grams) of acetaminophen (Tylenol) in 24 hours.  You should not take ibuprofen (Advil), aleve, motrin, naprosyn or other NSAIDS if you have a history of stomach ulcers or chronic kidney disease.  °2. A prescription for pain medication may be given to you upon discharge.  Take your pain medication as prescribed, if you still have uncontrolled pain after taking acetaminophen (Tylenol) or ibuprofen (Advil). °3. Use ice packs to help control pain. °4. If you need a refill on your pain medication, please contact your pharmacy.  They will contact our office to request authorization. Prescriptions will not be filled after 5pm or on week-ends. ° °HOME MEDICATIONS °5. Take your usually prescribed medications unless otherwise directed. ° °DIET °6. You should follow a light diet the first few days after arrival home.  Be sure to include lots of fluids daily. Avoid fatty, fried foods.  ° °CONSTIPATION °7. It is common to experience some constipation after surgery and if you are taking pain medication.  Increasing fluid intake and taking a stool softener (such as Colace) will usually help or prevent this problem from occurring.  A mild laxative (Milk of Magnesia or Miralax) should be taken according to package instructions if there are no bowel  movements after 48 hours. ° °WOUND/INCISION CARE °8. Most patients will experience some swelling and bruising in the area of the incisions.  Ice packs will help.  Swelling and bruising can take several days to resolve.  °9. Unless discharge instructions indicate otherwise, follow guidelines below  °a. STERI-STRIPS - you may remove your outer bandages 48 hours after surgery, and you may shower at that time.  You have steri-strips (small skin tapes) in place directly over the incision.  These strips should be left on the skin for 7-10 days.   °b. DERMABOND/SKIN GLUE - you may shower in 24 hours.  The glue will flake off over the next 2-3 weeks. °10. Any sutures or staples will be removed at the office during your follow-up visit. ° °ACTIVITIES °11. You may resume regular (light) daily activities beginning the next day--such as daily self-care, walking, climbing stairs--gradually increasing activities as tolerated.  You may have sexual intercourse when it is comfortable.  Refrain from any heavy lifting or straining until approved by your doctor. °a. You may drive when you are no longer taking prescription pain medication, you can comfortably wear a seatbelt, and you can safely maneuver your car and apply brakes. ° °FOLLOW-UP °12. You should see your doctor in the office for a follow-up appointment approximately 2-3 weeks after your surgery.  You should have been given your post-op/follow-up appointment when your surgery was scheduled.  If you did not receive a post-op/follow-up appointment, make sure that you call for this appointment within a day or two after you arrive home to insure a convenient appointment time. ° °OTHER   INSTRUCTIONS 13. Do not lift, push, or lift anything >15 lbs for 2 weeks  WHEN TO CALL YOUR DOCTOR: 1. Fever over 101.0 2. Inability to urinate 3. Continued bleeding from incision. 4. Increased pain, redness, or drainage from the incision. 5. Increasing abdominal pain  The clinic staff is  available to answer your questions during regular business hours.  Please dont hesitate to call and ask to speak to one of the nurses for clinical concerns.  If you have a medical emergency, go to the nearest emergency room or call 911.  A surgeon from [facility] is always on call at the hospital. [address], [place], [place], [place]  [address] ? [address], [place], [place]   [address] [phone] ? [phone] ? FAX [phone] Web site: [email or web address]

## 2020-03-24 NOTE — H&P (Signed)
CC: here for surgery  Requesting provider: n/a  HPI: Robin Gardner is an 54 y.o. female who is here for for lap chole with poss ioc. She denies any changes since seen in clinic.   The patient is a 54 year old female who presents for evaluation of gall stones. She is referred by Dr Quillian Quince for evaluation of gallstones. she is accompanied by her husband. She reports that she has a long-standing history of ulcerative colitis and is currently on methotrexate and Remicade however her UC issues are well under control. She states that she's been having intermittent right-sided upper abdominal pain for a few months. The discomfort will generally last a few hours. Most of time it is not that bad and she can distract herself and ignore it. She states this discomfort is different than her plastic UC pain. She also complains of worsening heartburn. She states that she has severe acid reflux at night. She is taking Protonix twice a day without any improvement. She will have discomfort in her upper chest that radiates to her back. It generally occurs in the evening. She denies any trouble swallowing liquids or solids. The right-sided pain is not times with her heartburn. It doesn't really radiate. She hasn't taken anything for it. Does not necessarily occur after eating. No prior abdominal surgery other than C-section. Had an abdominal ultrasound on June 3 that showed cholelithiasis, normal common bile duct. I reviewed her PCP is noted from mid May. She also had a CBC and comprehensive metabolic panel which were within normal limits. She hasn't seen her gastrologist for years.  Past Medical History:  Diagnosis Date  . Anxiety   . Arthritis    psoriatic - RA  . Depression   . Essential hypertension, benign 05/20/2013  . Fibromyalgia 2020  . GERD (gastroesophageal reflux disease)   . Hypothyroidism   . Seasonal allergies   . Skin cancer   . Ulcerative colitis ([place])    resolved in 2018     Past Surgical History:  Procedure Laterality Date  . ABDOMINAL HYSTERECTOMY    . Barbie Banner OSTEOTOMY Right 11/27/2012   Procedure: Barbie Banner OSTEOTOMY RIGHT FOOT;  Surgeon: Marcheta Grammes, DPM;  Location: [facility];  Service: Orthopedics;  Laterality: Right;  . Barbie Banner OSTEOTOMY Left 04/23/2013   Procedure: Barbie Banner OSTEOTOMY;  Surgeon: Marcheta Grammes, DPM;  Location: [facility];  Service: Orthopedics;  Laterality: Left;  . BLADDER SUSPENSION  2012  . BUNIONECTOMY Left 04/23/2013   Procedure: [place];  Surgeon: Marcheta Grammes, DPM;  Location: [facility];  Service: Orthopedics;  Laterality: Left;  . CESAREAN SECTION  2009  . COLONOSCOPY    . DILATION AND CURETTAGE OF UTERUS    . EYE SURGERY Bilateral 2020,2020   cataract  . OSTECTOMY Right 11/27/2012   Procedure: LUDLOFF OSTEOTOMY RIGHT FOOT;  Surgeon: Marcheta Grammes, DPM;  Location: [facility];  Service: Orthopedics;  Laterality: Right;    History reviewed. No pertinent family history.  Social:  reports that she has never smoked. She has never used smokeless tobacco. She reports current alcohol use of about 1.0 standard drink of alcohol per week. She reports that she does not use drugs.  Allergies:  Allergies  Allergen Reactions  . Advil [Ibuprofen] Other (See Comments) and Swelling    Colitis Flareup  . Augmentin [Amoxicillin-Pot Clavulanate] Nausea And Vomiting and Other (See Comments)    Colitis Flareup  . Biaxin [Clarithromycin]   . Chlorhexidine Itching  . Adhesive [Tape] Rash  .  Latex Rash and Other (See Comments)    Burning.   Michail Sermon [Enalapril] Cough    Medications: I have reviewed the patient's current medications.   ROS - all of the below systems have been reviewed with the patient and positives are indicated with bold text General: chills, fever or night sweats Eyes: blurry vision or double vision ENT: epistaxis or sore throat Allergy/Immunology: itchy/watery eyes or nasal  congestion Hematologic/Lymphatic: bleeding problems, blood clots or swollen lymph nodes Endocrine: temperature intolerance or unexpected weight changes Breast: new or changing breast lumps or nipple discharge Resp: cough, shortness of breath, or wheezing CV: chest pain or dyspnea on exertion GI: as per HPI GU: dysuria, trouble voiding, or hematuria MSK: joint pain or joint stiffness Neuro: TIA or stroke symptoms Derm: pruritus and skin lesion changes Psych: anxiety and depression  PE Blood pressure (!) 139/92, pulse 75, temperature 98.4 F (36.9 C), temperature source Oral, resp. rate 16, height 5\' 8"  (1.727 m), weight 85.7 kg, SpO2 98 %. Constitutional: NAD; conversant; no deformities Eyes: Moist conjunctiva; no lid lag; anicteric; PERRL Neck: Trachea midline; no thyromegaly Lungs: Normal respiratory effort; no tactile fremitus CV: RRR; no palpable thrills; no pitting edema GI: Abd soft, nt; no palpable hepatosplenomegaly MSK: Normal gait; no clubbing/cyanosis Psychiatric: Appropriate affect; alert and oriented x3 Lymphatic: No palpable cervical or axillary lymphadenopathy Skin:no rash/edema  No results found for this or any previous visit (from the past 64 hour(s)).  No results found.  Imaging: n/a  A/P: Robin Gardner is an 53 y.o. female with SYMPTOMATIC CHOLELITHIASIS (K80.20)  2 OR for laparoscopic cholecystectomy with possible cholangiogram IV antibiotic Enhanced recovery protocol All questions asked and answered Discussed surgery resident's involvement with the patient's case  GASTROESOPHAGEAL REFLUX DISEASE, UNSPECIFIED WHETHER ESOPHAGITIS PRESENT (K21.9) Impression: I think her reflux symptoms are not related to her gallbladder/cholelithiasis. I encouraged her to follow-up with her gastroenterologist Dr. Cristina Gong since it's been over 4 years. I also told her that I don't think cholecystectomy with necessarily improve her GERD symptoms CHRONIC ULCERATIVE  COLITIS WITHOUT COMPLICATION, UNSPECIFIED LOCATION (K51.90) Impression: well controlled on remicade Aycen Porreca M. Redmond Pulling, MD, FACS General, Bariatric, & Minimally Invasive Surgery [facility], [place]

## 2020-03-25 ENCOUNTER — Encounter (HOSPITAL_COMMUNITY): Payer: Self-pay | Admitting: General Surgery

## 2020-03-25 LAB — SURGICAL PATHOLOGY

## 2020-04-06 DIAGNOSIS — R7989 Other specified abnormal findings of blood chemistry: Secondary | ICD-10-CM | POA: Diagnosis not present

## 2020-04-07 DIAGNOSIS — K5190 Ulcerative colitis, unspecified, without complications: Secondary | ICD-10-CM | POA: Diagnosis not present

## 2020-04-26 DIAGNOSIS — Z1231 Encounter for screening mammogram for malignant neoplasm of breast: Secondary | ICD-10-CM | POA: Diagnosis not present

## 2020-04-26 DIAGNOSIS — Z13820 Encounter for screening for osteoporosis: Secondary | ICD-10-CM | POA: Diagnosis not present

## 2020-07-05 ENCOUNTER — Ambulatory Visit (INDEPENDENT_AMBULATORY_CARE_PROVIDER_SITE_OTHER): Payer: BC Managed Care – PPO | Admitting: Ophthalmology

## 2020-07-05 ENCOUNTER — Other Ambulatory Visit: Payer: Self-pay

## 2020-07-05 ENCOUNTER — Encounter (INDEPENDENT_AMBULATORY_CARE_PROVIDER_SITE_OTHER): Payer: Self-pay | Admitting: Ophthalmology

## 2020-07-05 DIAGNOSIS — H43813 Vitreous degeneration, bilateral: Secondary | ICD-10-CM | POA: Diagnosis not present

## 2020-07-05 DIAGNOSIS — H43393 Other vitreous opacities, bilateral: Secondary | ICD-10-CM

## 2020-07-05 NOTE — Assessment & Plan Note (Signed)
Minor impact OU today patient does not want any intervention simply to understand what they were

## 2020-07-05 NOTE — Progress Notes (Signed)
07/05/2020     CHIEF COMPLAINT Patient presents for Retina Evaluation (NP-2nd Opinion Vitreous Floaters OU, ref'd by Gershon Crane ///Pt reports a lot of "foggy", wavy floaters that are constant OU. Pt also reports black lines that come and go OU. Pt reports floaters definitely affect vision. Reading or working for an extended period of time gives her a headache. )   HISTORY OF PRESENT ILLNESS: Robin Gardner is a 55 y.o. female who presents to the clinic today for:   HPI    Retina Evaluation    In both eyes.  Associated Symptoms Floaters. Additional comments: NP-2nd Opinion Vitreous Floaters OU, ref'd by Gershon Crane    Pt reports a lot of "foggy", wavy floaters that are constant OU. Pt also reports black lines that come and go OU. Pt reports floaters definitely affect vision. Reading or working for an extended period of time gives her a headache.        Last edited by Nichola Sizer D on 07/05/2020  2:38 PM. (History)      Referring physician: Caryl Bis, MD [place],  [place] [address]  HISTORICAL INFORMATION:   Selected notes from the MEDICAL RECORD NUMBER       CURRENT MEDICATIONS: Current Outpatient Medications (Ophthalmic Drugs)  Medication Sig   hydroxypropyl methylcellulose / hypromellose (ISOPTO TEARS / GONIOVISC) 2.5 % ophthalmic solution Place 1 drop into both eyes 2 (two) times daily as needed for dry eyes.   No current facility-administered medications for this visit. (Ophthalmic Drugs)   Current Outpatient Medications (Other)  Medication Sig   amLODipine (NORVASC) 2.5 MG tablet TAKE 1 TABLET BY MOUTH ONCE DAILY. (Patient taking differently: Take 2.5 mg by mouth daily. )   Biotin 10000 MCG TABS Take 10,000 mcg by mouth daily.   Cholecalciferol (VITAMIN D) 125 MCG (5000 UT) CAPS Take 5,000 Units by mouth daily.    diclofenac Sodium (VOLTAREN) 1 % GEL Apply 2 g topically daily as needed (pain).   folic acid (FOLVITE) 1 MG tablet Take 1 mg by mouth  daily.    levothyroxine (SYNTHROID) 88 MCG tablet Take 88 mcg by mouth daily before breakfast.   methotrexate (RHEUMATREX) 2.5 MG tablet Take 10 mg by mouth once a week.    mirabegron ER (MYRBETRIQ) 50 MG TB24 tablet Take 50 mg by mouth daily.   oxyCODONE (OXY IR/ROXICODONE) 5 MG immediate release tablet Take 1 tablet (5 mg total) by mouth every 6 (six) hours as needed for breakthrough pain.   pantoprazole (PROTONIX) 40 MG tablet TAKE ONE TABLET BY MOUTH TWICE DAILY. (Patient taking differently: Take 40 mg by mouth 2 (two) times daily. )   pravastatin (PRAVACHOL) 20 MG tablet TAKE 1 TABLET BY MOUTH ONCE A DAY. (Patient taking differently: Take 20 mg by mouth daily. )   sertraline (ZOLOFT) 100 MG tablet Take 1 tablet (100 mg total) by mouth daily. (Patient taking differently: Take 100 mg by mouth in the morning and at bedtime. )   triamcinolone cream (KENALOG) 0.1 % Apply 1 application topically daily as needed (itching).   No current facility-administered medications for this visit. (Other)      REVIEW OF SYSTEMS:    ALLERGIES Allergies  Allergen Reactions   Advil [Ibuprofen] Other (See Comments) and Swelling    Colitis Flareup   Augmentin [Amoxicillin-Pot Clavulanate] Nausea And Vomiting and Other (See Comments)    Colitis Flareup   Biaxin [Clarithromycin]    Chlorhexidine Itching   Adhesive [Tape] Rash  Latex Rash and Other (See Comments)    Burning.    Vasotec [Enalapril] Cough    PAST MEDICAL HISTORY Past Medical History:  Diagnosis Date   Anxiety    Arthritis    psoriatic - RA   Depression    Essential hypertension, benign 05/20/2013   Fibromyalgia 2020   GERD (gastroesophageal reflux disease)    Hypothyroidism    Seasonal allergies    Skin cancer    Ulcerative colitis ([place])    resolved in 2018   Past Surgical History:  Procedure Laterality Date   ABDOMINAL HYSTERECTOMY     AIKEN OSTEOTOMY Right 11/27/2012   Procedure: Barbie Banner  OSTEOTOMY RIGHT FOOT;  Surgeon: Marcheta Grammes, DPM;  Location: [facility];  Service: Orthopedics;  Laterality: Right;   Barbie Banner OSTEOTOMY Left 04/23/2013   Procedure: Barbie Banner OSTEOTOMY;  Surgeon: Marcheta Grammes, DPM;  Location: [facility];  Service: Orthopedics;  Laterality: Left;   BLADDER SUSPENSION  2012   BUNIONECTOMY Left 04/23/2013   Procedure: [place];  Surgeon: Marcheta Grammes, DPM;  Location: [facility];  Service: Orthopedics;  Laterality: Left;   CESAREAN SECTION  2009   CHOLECYSTECTOMY N/A 03/24/2020   Procedure: LAPAROSCOPIC CHOLECYSTECTOMY WITH INTRAOPERATIVE CHOLANGIOGRAM;  Surgeon: Greer Pickerel, MD;  Location: [facility];  Service: General;  Laterality: N/A;   COLONOSCOPY     DILATION AND CURETTAGE OF UTERUS     EYE SURGERY Bilateral 2020,2020   cataract   OSTECTOMY Right 11/27/2012   Procedure: LUDLOFF OSTEOTOMY RIGHT FOOT;  Surgeon: Marcheta Grammes, DPM;  Location: [facility];  Service: Orthopedics;  Laterality: Right;    FAMILY HISTORY History reviewed. No pertinent family history.  SOCIAL HISTORY Social History   Tobacco Use   Smoking status: Never Smoker   Smokeless tobacco: Never Used  Vaping Use   Vaping Use: Never used  Substance Use Topics   Alcohol use: Yes    Alcohol/week: 1.0 standard drink    Types: 1 Glasses of wine per week    Comment: rarely   Drug use: No         OPHTHALMIC EXAM:  Base Eye Exam    Visual Acuity (ETDRS)      Right Left   Dist [place] 20/25 -1 20/30 -1   Dist ph [place]  20/20 -1       Tonometry (Tonopen, 2:44 PM)      Right Left   Pressure 13 12       Pupils      Pupils Dark Light Shape React APD   Right PERRL 5 5 Round Slow None   Left PERRL 5 5 Round Slow None       Visual Fields (Counting fingers)      Left Right    Full Full       Extraocular Movement      Right Left    Full Full       Neuro/Psych    Oriented x3: Yes       Dilation    Both eyes: 1.0% Mydriacyl, 2.5%  Phenylephrine @ 2:44 PM        Slit Lamp and Fundus Exam    External Exam      Right Left   External Normal Normal       Slit Lamp Exam      Right Left   Lids/Lashes Normal Normal   Conjunctiva/Sclera White and quiet White and quiet   Cornea Clear Clear   Anterior Chamber Deep and quiet  Deep and quiet   Iris Round and reactive Round and reactive   Lens Centered posterior chamber intraocular lens Centered posterior chamber intraocular lens   Anterior Vitreous Normal Normal       Fundus Exam      Right Left   Posterior Vitreous Posterior vitreous detachment, Central vitreous floaters Posterior vitreous detachment, Central vitreous floaters   Disc Normal Normal   C/D Ratio 0.35 0.4   Macula Normal Normal   Vessels Normal Normal   Periphery normal, no holes or tears normal, no holes or tears          IMAGING AND PROCEDURES  Imaging and Procedures for 07/05/20  OCT, Retina - OU - Both Eyes       Right Eye Quality was good. Scan locations included subfoveal. Central Foveal Thickness: 313. Progression has no prior data.   Left Eye Quality was good. Scan locations included subfoveal. Central Foveal Thickness: 319. Progression has no prior data.                 ASSESSMENT/PLAN:  Posterior vitreous detachment, both eyes   The nature of posterior vitreous detachment was discussed with the patient as well as its physiology, its age prevalence, and its possible implication regarding retinal breaks and detachment.  An informational brochure was given to the patient.  All the patient's questions were answered.  The patient was asked to return if new or different flashes or floaters develops.   Patient was instructed to contact office immediately if any changes were noticed. I explained to the patient that vitreous inside the eye is similar to jello inside a bowl. As the jello melts it can start to pull away from the bowl, similarly the vitreous throughout our lives can  begin to pull away from the retina. That process is called a posterior vitreous detachment. In some cases, the vitreous can tug hard enough on the retina to form a retinal tear. I discussed with the patient the signs and symptoms of a retinal detachment.  Do not rub the eye.  Discussed the prospects of observation is the best choice for managing floaters and vitreous debris if there is no impact on visual functioning and activities of daily living observation  No retinal holes or tears were found in either eye today  Vitreous floaters of both eyes Minor impact OU today patient does not want any intervention simply to understand what they were      ICD-10-CM   1. Posterior vitreous detachment, both eyes  H43.813   2. Vitreous floaters of both eyes  H43.393     1.  Patient currently has annoying symptoms of vitreous debris and floaters in each eye.  I assured her she has no complications of routine cataract surgery that this is a normal change in vitreous with aging hastened and spat up by the fact of cataract surgery in her eyes and everyone else.  Most importantly there are no retinal holes or tears.  2.  She understands that if her symptoms were to worsen to the point of impact on visual acuity and her activities of daily living, surgical intervention could be undertaken with a brief the although the major eye surgery lasting about 5 minutes.  3.  Ophthalmic Meds Ordered this visit:  No orders of the defined types were placed in this encounter.      Return in about 1 year (around 07/05/2021) for DILATE OU, COLOR FP, OCT.  There are no Patient Instructions on file  for this visit.   Explained the diagnoses, plan, and follow up with the patient and they expressed understanding.  Patient expressed understanding of the importance of proper follow up care.   Clent Demark Osha Errico M.D. Diseases & Surgery of the Retina and Vitreous Retina & Diabetic [place] 07/05/20     Abbreviations: M myopia (nearsighted); A astigmatism; H hyperopia (farsighted); P presbyopia; Mrx spectacle prescription;  CTL contact lenses; OD right eye; OS left eye; OU both eyes  XT exotropia; ET esotropia; PEK punctate epithelial keratitis; PEE punctate epithelial erosions; DES dry eye syndrome; MGD meibomian gland dysfunction; ATs artificial tears; PFAT's preservative free artificial tears; [place] nuclear sclerotic cataract; PSC posterior subcapsular cataract; ERM epi-retinal membrane; PVD posterior vitreous detachment; RD retinal detachment; DM diabetes mellitus; DR diabetic retinopathy; NPDR non-proliferative diabetic retinopathy; PDR proliferative diabetic retinopathy; CSME clinically significant macular edema; DME diabetic macular edema; dbh dot blot hemorrhages; CWS cotton wool spot; POAG primary open angle glaucoma; C/D cup-to-disc ratio; HVF humphrey visual field; GVF goldmann visual field; OCT optical coherence tomography; IOP intraocular pressure; BRVO Branch retinal vein occlusion; CRVO central retinal vein occlusion; CRAO central retinal artery occlusion; BRAO branch retinal artery occlusion; RT retinal tear; SB scleral buckle; PPV pars plana vitrectomy; VH Vitreous hemorrhage; PRP panretinal laser photocoagulation; IVK intravitreal kenalog; VMT vitreomacular traction; MH Macular hole;  NVD neovascularization of the disc; NVE neovascularization elsewhere; AREDS age related eye disease study; ARMD age related macular degeneration; POAG primary open angle glaucoma; EBMD epithelial/anterior basement membrane dystrophy; ACIOL anterior chamber intraocular lens; IOL intraocular lens; PCIOL posterior chamber intraocular lens; Phaco/IOL phacoemulsification with intraocular lens placement; [place] photorefractive keratectomy; LASIK laser assisted in situ keratomileusis; HTN hypertension; DM diabetes mellitus; COPD chronic obstructive pulmonary disease

## 2020-07-05 NOTE — Assessment & Plan Note (Signed)
The nature of posterior vitreous detachment was discussed with the patient as well as its physiology, its age prevalence, and its possible implication regarding retinal breaks and detachment.  An informational brochure was given to the patient.  All the patient's questions were answered.  The patient was asked to return if new or different flashes or floaters develops.   Patient was instructed to contact office immediately if any changes were noticed. I explained to the patient that vitreous inside the eye is similar to jello inside a bowl. As the jello melts it can start to pull away from the bowl, similarly the vitreous throughout our lives can begin to pull away from the retina. That process is called a posterior vitreous detachment. In some cases, the vitreous can tug hard enough on the retina to form a retinal tear. I discussed with the patient the signs and symptoms of a retinal detachment.  Do not rub the eye.  Discussed the prospects of observation is the best choice for managing floaters and vitreous debris if there is no impact on visual functioning and activities of daily living observation  No retinal holes or tears were found in either eye today

## 2020-07-06 ENCOUNTER — Encounter (INDEPENDENT_AMBULATORY_CARE_PROVIDER_SITE_OTHER): Payer: Self-pay

## 2021-06-14 ENCOUNTER — Other Ambulatory Visit: Payer: Self-pay | Admitting: Obstetrics and Gynecology

## 2021-06-14 DIAGNOSIS — Z8249 Family history of ischemic heart disease and other diseases of the circulatory system: Secondary | ICD-10-CM

## 2021-07-11 ENCOUNTER — Encounter (INDEPENDENT_AMBULATORY_CARE_PROVIDER_SITE_OTHER): Payer: BC Managed Care – PPO | Admitting: Ophthalmology

## 2021-07-14 ENCOUNTER — Ambulatory Visit
Admission: RE | Admit: 2021-07-14 | Discharge: 2021-07-14 | Disposition: A | Discharge status: Home / Self Care | Payer: No Typology Code available for payment source | Source: Ambulatory Visit | Attending: Obstetrics and Gynecology | Admitting: Obstetrics and Gynecology

## 2021-07-14 DIAGNOSIS — Z8249 Family history of ischemic heart disease and other diseases of the circulatory system: Secondary | ICD-10-CM

## 2021-07-27 ENCOUNTER — Ambulatory Visit (INDEPENDENT_AMBULATORY_CARE_PROVIDER_SITE_OTHER): Payer: BC Managed Care – PPO | Admitting: Ophthalmology

## 2021-07-27 ENCOUNTER — Other Ambulatory Visit: Payer: Self-pay

## 2021-07-27 DIAGNOSIS — H43393 Other vitreous opacities, bilateral: Secondary | ICD-10-CM | POA: Diagnosis not present

## 2021-07-27 DIAGNOSIS — H43813 Vitreous degeneration, bilateral: Secondary | ICD-10-CM

## 2021-07-27 NOTE — Assessment & Plan Note (Signed)
OU, stable no holes or tears ?

## 2021-07-27 NOTE — Assessment & Plan Note (Signed)
Ongoing symptoms OU centrally but no visual debilitating symptoms thus continue to observe ?

## 2021-07-27 NOTE — Progress Notes (Signed)
? ? ?07/27/2021 ? ?  ? ?CHIEF COMPLAINT ?Patient presents for  ?Chief Complaint  ?Patient presents with  ? Retina Follow Up  ? Flashes/floaters  ? ? ? ? ?HISTORY OF PRESENT ILLNESS: ?Robin Gardner is a 56 y.o. female who presents to the clinic today for:  ? ?HPI   ? ? Retina Follow Up   ? ?      ? Diagnosis: Other  ? Laterality: both eyes  ? Onset: 7 days ago  ? Severity: severe  ? Course: stable  ? ?  ?  ? ? Flashes/floaters   ? ?      ? Laterality: both eyes  ? ?  ?  ? ? Comments   ?1 yr fu ou oct. ?Pt states she sees floaters daily. OU ?Pt denies FOL. ?Pt is now taking sertraline '100mg'$  wice a day ? ? ?  ?  ?Last edited by Hurman Horn, MD on 07/27/2021 10:39 AM.  ?  ? ? ?Referring physician: ?Rutherford Guys, MD ?[address] ?[place],  [place] [address] ? ?HISTORICAL INFORMATION:  ? ?Selected notes from the [facility] ?  ?   ? ?CURRENT MEDICATIONS: ?Current Outpatient Medications (Ophthalmic Drugs)  ?Medication Sig  ? hydroxypropyl methylcellulose / hypromellose (ISOPTO TEARS / GONIOVISC) 2.5 % ophthalmic solution Place 1 drop into both eyes 2 (two) times daily as needed for dry eyes.  ? ?No current facility-administered medications for this visit. (Ophthalmic Drugs)  ? ?Current Outpatient Medications (Other)  ?Medication Sig  ? amLODipine (NORVASC) 2.5 MG tablet TAKE 1 TABLET BY MOUTH ONCE DAILY. (Patient taking differently: Take 2.5 mg by mouth daily. )  ? Biotin 10000 MCG TABS Take 10,000 mcg by mouth daily.  ? Cholecalciferol (VITAMIN D) 125 MCG (5000 UT) CAPS Take 5,000 Units by mouth daily.   ? diclofenac Sodium (VOLTAREN) 1 % GEL Apply 2 g topically daily as needed (pain).  ? folic acid (FOLVITE) 1 MG tablet Take 1 mg by mouth daily.   ? levothyroxine (SYNTHROID) 88 MCG tablet Take 88 mcg by mouth daily before breakfast.  ? methotrexate (RHEUMATREX) 2.5 MG tablet Take 10 mg by mouth once a week.   ? mirabegron ER (MYRBETRIQ) 50 MG TB24 tablet Take 50 mg by mouth daily.  ? oxyCODONE (OXY  IR/ROXICODONE) 5 MG immediate release tablet Take 1 tablet (5 mg total) by mouth every 6 (six) hours as needed for breakthrough pain.  ? pantoprazole (PROTONIX) 40 MG tablet TAKE ONE TABLET BY MOUTH TWICE DAILY. (Patient taking differently: Take 40 mg by mouth 2 (two) times daily. )  ? pravastatin (PRAVACHOL) 20 MG tablet TAKE 1 TABLET BY MOUTH ONCE A DAY. (Patient taking differently: Take 20 mg by mouth daily. )  ? sertraline (ZOLOFT) 100 MG tablet Take 1 tablet (100 mg total) by mouth daily. (Patient taking differently: Take 100 mg by mouth in the morning and at bedtime. )  ? triamcinolone cream (KENALOG) 0.1 % Apply 1 application topically daily as needed (itching).  ? ?No current facility-administered medications for this visit. (Other)  ? ? ? ? ?REVIEW OF SYSTEMS: ?ROS   ?Negative for: Constitutional, Gastrointestinal, Neurological, Skin, Genitourinary, Musculoskeletal, HENT, Endocrine, Cardiovascular, Eyes, Respiratory, Psychiatric, Allergic/Imm, Heme/Lymph ?Last edited by Silvestre Moment on 07/27/2021  9:46 AM.  ?  ? ? ? ?ALLERGIES ?Allergies  ?Allergen Reactions  ? Advil [Ibuprofen] Other (See Comments) and Swelling  ?  Colitis Flareup  ? Augmentin [Amoxicillin-Pot Clavulanate] Nausea And Vomiting and Other (See  Comments)  ?  Colitis Flareup  ? Biaxin [Clarithromycin]   ? Chlorhexidine Itching  ? Adhesive [Tape] Rash  ? Latex Rash and Other (See Comments)  ?  Burning.   ? Vasotec [Enalapril] Cough  ? ? ?PAST MEDICAL HISTORY ?Past Medical History:  ?Diagnosis Date  ? Anxiety   ? Arthritis   ? psoriatic - Gardner  ? Depression   ? Essential hypertension, benign 05/20/2013  ? Fibromyalgia 2020  ? GERD (gastroesophageal reflux disease)   ? Hypothyroidism   ? Seasonal allergies   ? Skin cancer   ? Ulcerative colitis ([place])   ? resolved in 2018  ? ?Past Surgical History:  ?Procedure Laterality Date  ? ABDOMINAL HYSTERECTOMY    ? AIKEN OSTEOTOMY Right 11/27/2012  ? Procedure: Barbie Banner OSTEOTOMY RIGHT FOOT;  Surgeon: Marcheta Grammes, DPM;  Location: [facility];  Service: Orthopedics;  Laterality: Right;  ? AIKEN OSTEOTOMY Left 04/23/2013  ? Procedure: [facility];  Surgeon: Marcheta Grammes, DPM;  Location: [facility];  Service: Orthopedics;  Laterality: Left;  ? BLADDER SUSPENSION  2012  ? BUNIONECTOMY Left 04/23/2013  ? Procedure: Altamese Ackerly;  Surgeon: Marcheta Grammes, DPM;  Location: [facility];  Service: Orthopedics;  Laterality: Left;  ? CESAREAN SECTION  2009  ? CHOLECYSTECTOMY N/A 03/24/2020  ? Procedure: LAPAROSCOPIC CHOLECYSTECTOMY WITH INTRAOPERATIVE CHOLANGIOGRAM;  Surgeon: Greer Pickerel, MD;  Location: [facility];  Service: General;  Laterality: N/A;  ? COLONOSCOPY    ? DILATION AND CURETTAGE OF UTERUS    ? EYE SURGERY Bilateral 2020,2020  ? cataract  ? OSTECTOMY Right 11/27/2012  ? Procedure: LUDLOFF OSTEOTOMY RIGHT FOOT;  Surgeon: Marcheta Grammes, DPM;  Location: [facility];  Service: Orthopedics;  Laterality: Right;  ? ? ?FAMILY HISTORY ?No family history on file. ? ?SOCIAL HISTORY ?Social History  ? ?Tobacco Use  ? Smoking status: Never  ? Smokeless tobacco: Never  ?Vaping Use  ? Vaping Use: Never used  ?Substance Use Topics  ? Alcohol use: Yes  ?  Alcohol/week: 1.0 standard drink  ?  Types: 1 Glasses of wine per week  ?  Comment: rarely  ? Drug use: No  ? ?  ? ?  ? ?OPHTHALMIC EXAM: ? ?Base Eye Exam   ? ? Visual Acuity (ETDRS)   ? ?   Right Left  ? Dist [place] 20/20 20/30 -1  ? Dist ph [place]  20/20  ? ?  ?  ? ? Tonometry (Tonopen, 9:54 AM)   ? ?   Right Left  ? Pressure 13 16  ? ?  ?  ? ? Pupils   ? ?   Dark Light Shape React APD  ? Right 4 3 Round Brisk None  ? Left 4 3 Round Brisk None  ? ?  ?  ? ? Visual Fields   ? ?   Left Right  ?  Full Full  ? ?  ?  ? ? Extraocular Movement   ? ?   Right Left  ?  Full Full  ? ?  ?  ? ? Neuro/Psych   ? ? Oriented x3: Yes  ? ?  ?  ? ? Dilation   ? ? Both eyes: 1.0% Mydriacyl, 2.5% Phenylephrine @ 9:54 AM  ? ?  ?  ? ?  ? ?Slit Lamp and Fundus Exam   ? ? External Exam   ? ?    Right Left  ? External Normal Normal  ? ?  ?  ? ?  Slit Lamp Exam   ? ?   Right Left  ? Lids/Lashes Normal Normal  ? Conjunctiva/Sclera White and quiet White and quiet  ? Cornea Clear Clear  ? Anterior Chamber Deep and quiet Deep and quiet  ? Iris Round and reactive Round and reactive  ? Lens Centered posterior chamber intraocular lens Centered posterior chamber intraocular lens  ? Anterior Vitreous Normal Normal  ? ?  ?  ? ? Fundus Exam   ? ?   Right Left  ? Posterior Vitreous Posterior vitreous detachment, Central vitreous floaters Posterior vitreous detachment, Central vitreous floaters  ? Disc Normal Normal  ? C/D Ratio 0.35 0.4  ? Macula Normal Normal  ? Vessels Normal Normal  ? Periphery normal, no holes or tears normal, no holes or tears  ? ?  ?  ? ?  ? ? ?IMAGING AND PROCEDURES  ?Imaging and Procedures for 07/27/21 ? ?OCT, Retina - OU - Both Eyes   ? ?   ?Right Eye ?Quality was good. Scan locations included subfoveal. Central Foveal Thickness: 316. Progression has been stable.  ? ?Left Eye ?Quality was good. Scan locations included subfoveal. Central Foveal Thickness: 311. Progression has been stable.  ? ?Notes ?PVD OU, bilateral shadowing effect of floaters noted over the posterior pole. ? ?  ? ?Color Fundus Photography Optos - OU - Both Eyes   ? ?   ?Right Eye ?Progression has no prior data. Disc findings include normal observations. Macula : normal observations.  ? ?Left Eye ?Progression has no prior data. Disc findings include normal observations. Macula : normal observations.  ? ?Notes ?PVD OU, with PVD OU and floaters in ? ?  ? ? ?  ?  ? ?  ?ASSESSMENT/PLAN: ? ?Posterior vitreous detachment, both eyes ?OU, stable no holes or tears ? ?Vitreous floaters of both eyes ?Ongoing symptoms OU centrally but no visual debilitating symptoms thus continue to observe  ? ?  ICD-10-CM   ?1. Posterior vitreous detachment, both eyes  H43.813 OCT, Retina - OU - Both Eyes  ?  Color Fundus Photography Optos - OU - Both  Eyes  ?  ?2. Vitreous floaters of both eyes  H43.393   ?  ? ? ?1.  Persistent central vitreous floaters with moderate symptomatic impact.  No effect and no impact on activities of daily living therefore we recommend o

## 2022-12-10 NOTE — Progress Notes (Unsigned)
Cardiology Office Note:  .   Date:  12/12/2022  ID:  Robin Gardner, DOB 1966/04/16, MRN [id number] PCP: Richardean Chimera, MD  [facility] Providers Cardiologist:  None    Patient Profile: .      PMH: Hyperlipidemia Hypertension Arthritis  Family history of heart disease  Mother - quadruple bypass age late 86s Maternal side - multiple siblings, grandmother Father - high BP, unsure about cholesterol, had MI and stents, died from brain bleed Coronary artery disease CT cardiac score 31  (87th percentile) on 07/15/2021  Referred to cardiology for elevated CT calcium score       History of Present Illness: Robin Gardner is a very pleasant 57 y.o. female who is here today to establish cardiology care for elevated coronary calcium score.  She reports she has been seeing a new provider who was concerned about past CT calcium score and recommended referral. 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 Reports housework is "just the basics." Admits she is frequently tired, gives out of energy quickly. Gets short of breath just walking through the yard. Exercises occasionally, mostly when they are traveling, not on a consistent basis at home.  She denies chest pain, palpitations, orthopnea, PND, edema, presyncope or syncope. Has arthritis pain in her joints. Admits it feels better when she moves more.   Diet: Breakfast - Belvita bars,  frequently skips lunch. Admits she has a strong sweet tooth, will eat a few cookies throughout the day. Is trying to eat more fruit.  Dinner - "that's where I am bad." Typically eat out, she rarely cooks - [employer], grills chicken, steak or burgers.   Activity: Occasional walk or hike, more so on vacation than at home  ASCVD Risk Score: 2.1%  ROS: See HPI       Studies Reviewed: Marland Kitchen   EKG Interpretation Date/Time:  Wednesday December 12 2022 11:31:31 EDT Ventricular Rate:  70 PR Interval:  154 QRS  Duration:  86 QT Interval:  426 QTC Calculation: 460 R Axis:   10  Text Interpretation: Normal sinus rhythm Normal ECG When compared with ECG of 11-Mar-2020 09:03, No significant change was found No ST abnormality Confirmed by Eligha Bridegroom [phone]) on 12/12/2022 11:39:57 AM     Risk Assessment/Calculations:             Physical Exam:   VS:  BP 128/85   Pulse 71   Ht 5\' 8"  (1.727 m)   Wt 185 lb (83.9 kg)   BMI 28.13 kg/m    Wt Readings from Last 3 Encounters:  12/12/22 185 lb (83.9 kg)  03/24/20 189 lb (85.7 kg)  03/11/20 189 lb (85.7 kg)    GEN: Well nourished, well developed in no acute distress NECK: No JVD; No carotid bruits CARDIAC: RRR, 3/6 systolic murmur. No rubs, gallops RESPIRATORY:  Clear to auscultation without rales, wheezing or rhonchi  ABDOMEN: Soft, non-tender, non-distended EXTREMITIES:  No edema; No deformity     ASSESSMENT AND PLAN: .    Coronary arter disease: CT cardiac score 07/15/2021 with CAC 31 (87th percentile) with calcium noted only in LAD. ASCVD risk score is low at 2.1%, however she is having DOE as noted below. No chest pain. EKG is unremarkable today.  She has been taking pravastatin for several years and most recent lipid panel 06/2021 revealed cholesterol is well-controlled. We will get coronary CTA and echo for further ischemia  evaluation. Will recheck lipid panel today.  Encouraged heart healthy diet avoiding saturated fat, sugar, processed foods, and simple carbohydrates.  She cannot take aspirin due to history of ulcerative colitis.  DOE: She is having DOE with mild to moderate exertion. Reports her husband noticed her breathing hard on most recent hike about 2 months ago. She does not exercise on a regular basis but reports getting short of breath just walking through her yard.  No chest pain.  We will get coronary CTA for mapping of coronary anatomy.  Will get echocardiogram to evaluate heart function. I will see her back after testing to  review results.   Murmur: She has a 3/6 systolic murmur on exam.  Reports she has never been aware of a murmur in the past.  No family history of valve disease to her awareness.  We will get echocardiogram to evaluate for structural heart disease.  Dyslipidemia: Most recent cholesterol results are February 2023 at which time LDL was 62, triglycerides were 159, and HDL was 52.  We will recheck today. Will continue pravastatin for now. Will await lipid results and CTA for further risk stratification.   Hypertension: BP is well controlled on amlodipine.        Dispo: 2 months with me  Signed, Eligha Bridegroom, NP-C

## 2022-12-12 ENCOUNTER — Encounter (HOSPITAL_BASED_OUTPATIENT_CLINIC_OR_DEPARTMENT_OTHER): Payer: Self-pay | Admitting: Nurse Practitioner

## 2022-12-12 ENCOUNTER — Ambulatory Visit (HOSPITAL_BASED_OUTPATIENT_CLINIC_OR_DEPARTMENT_OTHER): Payer: BC Managed Care – PPO | Admitting: Nurse Practitioner

## 2022-12-12 VITALS — BP 128/85 | HR 71 | Ht 68.0 in | Wt 185.0 lb

## 2022-12-12 DIAGNOSIS — R0609 Other forms of dyspnea: Secondary | ICD-10-CM

## 2022-12-12 DIAGNOSIS — I10 Essential (primary) hypertension: Secondary | ICD-10-CM

## 2022-12-12 DIAGNOSIS — I2510 Atherosclerotic heart disease of native coronary artery without angina pectoris: Secondary | ICD-10-CM

## 2022-12-12 DIAGNOSIS — E785 Hyperlipidemia, unspecified: Secondary | ICD-10-CM | POA: Diagnosis not present

## 2022-12-12 DIAGNOSIS — R011 Cardiac murmur, unspecified: Secondary | ICD-10-CM | POA: Diagnosis not present

## 2022-12-12 MED ORDER — METOPROLOL TARTRATE 100 MG PO TABS: ORAL_TABLET | ORAL | 0 refills | Status: DC

## 2022-12-12 NOTE — Patient Instructions (Addendum)
Medication Instructions:   Your physician recommends that you continue on your current medications as directed. Please refer to the Current Medication list given to you today.   *If you need a refill on your cardiac medications before your next appointment, please call your pharmacy*   Lab Work:  TODAY!!!! LIPID/BMET  If you have labs (blood work) drawn today and your tests are completely normal, you will receive your results only by: MyChart Message (if you have MyChart) OR A paper copy in the mail If you have any lab test that is abnormal or we need to change your treatment, we will call you to review the results.   Testing/Procedures:    Your cardiac CT will be scheduled at one of the below locations:   [facility] [address] [place], [place] [address] [phone]   If scheduled at [facility], please arrive at the [facility] and Children's Entrance (Entrance C2) of [facility] 30 minutes prior to test start time. You can use the FREE valet parking offered at entrance C (encouraged to control the heart rate for the test)  Proceed to the [facility] Radiology Department (first floor) to check-in and test prep.  All radiology patients and guests should use entrance C2 at [facility], accessed from [facility], even though the hospital's physical address listed is [address]    Please follow these instructions carefully (unless otherwise directed):  An IV will be required for this test and Nitroglycerin will be given.  On the Night Before the Test: Be sure to Drink plenty of water. Do not consume any caffeinated/decaffeinated beverages or chocolate 12 hours prior to your test. Do not take any antihistamines 12 hours prior to your test. On the Day of the Test: Drink plenty of water until 1 hour prior to the test. Do not eat any food 1 hour prior to test. You may take your regular medications prior to the  test.  Take metoprolol (Lopressor) one tablet by mouth (100 mg )two hours prior to test. FEMALES- please wear underwire-free bra if available, avoid dresses & tight clothing      After the Test: Drink plenty of water. After receiving IV contrast, you may experience a mild flushed feeling. This is normal. On occasion, you may experience a mild rash up to 24 hours after the test. This is not dangerous. If this occurs, you can take Benadryl 25 mg and increase your fluid intake. If you experience trouble breathing, this can be serious. If it is severe call 911 IMMEDIATELY. If it is mild, please call our office.  We will call to schedule your test 2-4 weeks out understanding that some insurance companies will need an authorization prior to the service being performed.   For more information and frequently asked questions, please visit our website : [email or web address]  For non-scheduling related questions, please contact the cardiac imaging nurse navigator should you have any questions/concerns: Cardiac Imaging Nurse Navigators Direct Office Dial: [phone]   For scheduling needs, including cancellations and rescheduling, please call [place], [phone].   Your physician has requested that you have an echocardiogram. Echocardiography is a painless test that uses sound waves to create images of your heart. It provides your doctor with information about the size and shape of your heart and how well your heart's chambers and valves are working. This procedure takes approximately one hour. There are no restrictions for this procedure. Please do NOT wear cologne, perfume or  lotions (deodorant is allowed). Please arrive 15 minutes prior to your appointment time.   Follow-Up: At [facility], you and your health needs are our priority.  As part of our continuing mission to provide you with exceptional heart care, we have created designated Provider Care Teams.  These Care  Teams include your primary Cardiologist (physician) and Advanced Practice Providers (APPs -  Physician Assistants and Nurse Practitioners) who all work together to provide you with the care you need, when you need it.  We recommend signing up for the patient portal called "MyChart".  Sign up information is provided on this After Visit Summary.  MyChart is used to connect with patients for Virtual Visits (Telemedicine).  Patients are able to view lab/test results, encounter notes, upcoming appointments, etc.  Non-urgent messages can be sent to your provider as well.   To learn more about what you can do with MyChart, go to [email or web address].    Your next appointment:   2 month(s)  Provider:   Eligha Bridegroom, NP

## 2022-12-14 ENCOUNTER — Encounter (HOSPITAL_COMMUNITY): Payer: Self-pay

## 2022-12-19 ENCOUNTER — Ambulatory Visit (HOSPITAL_COMMUNITY)
Admission: RE | Admit: 2022-12-19 | Discharge: 2022-12-19 | Disposition: A | Discharge status: Home / Self Care | Payer: BC Managed Care – PPO | Source: Ambulatory Visit | Attending: Nurse Practitioner | Admitting: Nurse Practitioner

## 2022-12-19 DIAGNOSIS — R011 Cardiac murmur, unspecified: Secondary | ICD-10-CM

## 2022-12-19 DIAGNOSIS — I700 Atherosclerosis of aorta: Secondary | ICD-10-CM | POA: Diagnosis not present

## 2022-12-19 DIAGNOSIS — R0609 Other forms of dyspnea: Secondary | ICD-10-CM | POA: Diagnosis present

## 2022-12-19 DIAGNOSIS — E785 Hyperlipidemia, unspecified: Secondary | ICD-10-CM | POA: Insufficient documentation

## 2022-12-19 MED ORDER — NITROGLYCERIN 0.4 MG SL SUBL
0.8000 mg | SUBLINGUAL_TABLET | Freq: Once | SUBLINGUAL | Status: AC
  Administered 2022-12-19: 0.8 mg via SUBLINGUAL

## 2022-12-19 MED ORDER — IOHEXOL 350 MG/ML SOLN
95.0000 mL | Freq: Once | INTRAVENOUS | Status: AC | PRN
  Administered 2022-12-19: 95 mL via INTRAVENOUS

## 2022-12-19 MED ORDER — NITROGLYCERIN 0.4 MG SL SUBL
SUBLINGUAL_TABLET | SUBLINGUAL | Status: AC
  Filled 2022-12-19: qty 2

## 2023-01-01 ENCOUNTER — Ambulatory Visit (INDEPENDENT_AMBULATORY_CARE_PROVIDER_SITE_OTHER): Payer: BC Managed Care – PPO

## 2023-01-01 DIAGNOSIS — R0609 Other forms of dyspnea: Secondary | ICD-10-CM

## 2023-01-01 DIAGNOSIS — E785 Hyperlipidemia, unspecified: Secondary | ICD-10-CM

## 2023-01-01 DIAGNOSIS — R011 Cardiac murmur, unspecified: Secondary | ICD-10-CM

## 2023-01-01 LAB — ECHOCARDIOGRAM COMPLETE
Area-P 1/2: 3.65 cm2
S' Lateral: 2.09 cm

## 2023-02-20 NOTE — Progress Notes (Signed)
Cardiology Office Note:  .   Date:  02/27/2023  ID:  Robin Gardner, DOB August 24, 1965, MRN [id number] PCP: Robin Chimera, MD  [facility] Providers Cardiologist:  Robin Si, MD    Patient Profile: .      PMH: Hyperlipidemia Hypertension Arthritis  Family history of heart disease  Mother - quadruple bypass age late 36s Maternal side - multiple siblings, grandmother Father - high BP, unsure about cholesterol, had MI and stents, died from brain bleed Coronary artery disease CT cardiac score 31  (87th percentile) on 07/15/2021  Referred to cardiology for elevated CT calcium score. Reported establishing care with a new provider who was concerned about past CT calcium score and recommended referral. 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 Reports housework is "just the basics." Admits she is frequently tired, gives out of energy quickly. Gets short of breath just walking through the yard. Exercises occasionally, mostly when they are traveling, not on a consistent basis at home. She denied chest pain, palpitations, orthopnea, PND, edema, presyncope or syncope. Has arthritis pain in her joints. Admitted pain is better when she moves more.  Diet typically includes Belvita bars for breakfast, frequently skips lunch.  Admits to having a strong sweet tooth.  Typically eats out for dinner "that is where I am bad" including frequent meals at [employer]. If they cook at home, they typically grill chicken, steak or burgers. Her ASCVD risk score was 2.1%.  Through shared decision making we elected to complete coronary CTA for evaluation of ischemia.  A murmur was heard on exam and echo was also ordered. Lipid panel completed at that time: Total cholesterol 140, triglycerides 134, HDL 50, and LDL 67.  Coronary CTA revealed CAC 12 (77th percentile).  Mild nonobstructive CAD in the left anterior descending artery and the left circumflex.  Echocardiogram  completed 01/01/2023 revealed normal LVEF 60 to 65%, grade 2 diastolic dysfunction, normal RV, trivial MR. She was encouraged to work on lifestyle modification.       History of Present Illness: Robin Gardner is a very pleasant 57 y.o. female who is here today for follow-up. We discussed the results of her recent echo and coronary CTA in detail. She reports intermittent shortness of breath that occurs at random times, both during activity and at rest. She recently went on a cruise to [place] where she hiked over fifty miles. She had joint pain during the hike but managed to complete it and did not have this of breath.  Reports a sugar addiction which she believes is exacerbated by stress. She has been trying to make healthier dietary choices, particularly during her recent cruise where she consumed a lot of fruit. Her weight is down two pounds, which she attributes to the physical activity during her cruise.  She is not consistently exercising at home. No chest pain, palpitations, orthopnea, PND, edema, presyncope, syncope.  She denies any specific cardiac concerns.  ROS: See HPI       Studies Reviewed: .         Risk Assessment/Calculations:             Physical Exam:   VS:  BP 120/80   Pulse 69   Ht 5\' 8"  (1.727 m)   Wt 183 lb 3.2 oz (83.1 kg)   SpO2 98%   BMI 27.86 kg/m    Wt Readings from Last 3 Encounters:  02/27/23 183  lb 3.2 oz (83.1 kg)  12/12/22 185 lb (83.9 kg)  03/24/20 189 lb (85.7 kg)    GEN: Well nourished, well developed in no acute distress NECK: No JVD; No carotid bruits CARDIAC: RRR, 3/6 systolic murmur. No rubs, gallops RESPIRATORY:  Clear to auscultation without rales, wheezing or rhonchi  ABDOMEN: Soft, non-tender, non-distended EXTREMITIES:  No edema; No deformity     ASSESSMENT AND PLAN: .    Coronary artery disease: Referred to cardiology for elevated CT cardiac score completed 07/15/2021 with CAC 31 (87th percentile) with calcium noted only in LAD.  She reported dyspnea at rest and with exertion, so coronary CTA was ordered to evaluate for ischemia. Coronary CTA completed 12/19/22 revealed mild nonobstructive CAD. She continues to have dyspnea both with exertion and rest. As noted below, dyspnea may be secondary to diastolic dysfunction noted on echo. Encouraged low sodium diet and adequate BP control. Encouraged heart healthy diet avoiding saturated fat, sugar, processed foods, and simple carbohydrates and aim for 150 minutes of moderate intensity exercise each week.  She cannot take aspirin due to history of ulcerative colitis. Continue amlodipine, pravastatin.  DOE/Diastolic dysfunction: She continues to have dyspnea on occasion both at rest and with exertion. It does not worsen with increased activity. She recently hiked a total of 50 miles on a trip to [place] without significant DOE. Echocardiogram completed 01/01/2023 shows normal EF 60 to 65% and grade 2 diastolic dysfunction. Appears euvolemic on exam. She denies orthopnea, PND, or edema. Good BP control, regular physical activity, and low-sodium diet emphasized.   Murmur: Echo completed 01/01/23 revealed trivial mitral valve regurgitation. We discussed no need to repeat testing in the next few years unless clinically indicated. Advised her to notify us if she develops concerning symptoms.   Dyslipidemia LDL goal < 70: Lipid panel 12/12/2022: Total cholesterol 140, triglycerides 134, HDL 50, LDL 67. She is at goal on pravastatin 20 mg daily. Encouraged heart healthy diet avoiding saturated fat, processed foods, simple carbohydrates, and sugar and 150 minutes moderate intensity exercise each week.   Hypertension: BP is well controlled on amlodipine.  No medication changes today.       Dispo: 1 year with Dr. Duke Gardner or me  Signed, Robin Bridegroom, NP-C

## 2023-02-27 ENCOUNTER — Encounter (HOSPITAL_BASED_OUTPATIENT_CLINIC_OR_DEPARTMENT_OTHER): Payer: Self-pay | Admitting: Nurse Practitioner

## 2023-02-27 ENCOUNTER — Ambulatory Visit (HOSPITAL_BASED_OUTPATIENT_CLINIC_OR_DEPARTMENT_OTHER): Payer: BC Managed Care – PPO | Admitting: Nurse Practitioner

## 2023-02-27 VITALS — BP 120/80 | HR 69 | Ht 68.0 in | Wt 183.2 lb

## 2023-02-27 DIAGNOSIS — I5189 Other ill-defined heart diseases: Secondary | ICD-10-CM | POA: Diagnosis not present

## 2023-02-27 DIAGNOSIS — R0609 Other forms of dyspnea: Secondary | ICD-10-CM | POA: Diagnosis not present

## 2023-02-27 DIAGNOSIS — I2510 Atherosclerotic heart disease of native coronary artery without angina pectoris: Secondary | ICD-10-CM

## 2023-02-27 DIAGNOSIS — E785 Hyperlipidemia, unspecified: Secondary | ICD-10-CM

## 2023-02-27 DIAGNOSIS — R011 Cardiac murmur, unspecified: Secondary | ICD-10-CM | POA: Diagnosis not present

## 2023-02-27 DIAGNOSIS — I10 Essential (primary) hypertension: Secondary | ICD-10-CM

## 2023-02-27 NOTE — Patient Instructions (Addendum)
Significant short medication Instructions:   Your physician recommends that you continue on your current medications as directed. Please refer to the Current Medication list given to you today.   *If you need a refill on your cardiac medications before your next appointment, please call your pharmacy*   Lab Work:  None ordered.  If you have labs (blood work) drawn today and your tests are completely normal, you will receive your results only by: MyChart Message (if you have MyChart) OR A paper copy in the mail If you have any lab test that is abnormal or we need to change your treatment, we will call you to review the results.   Testing/Procedures:  None ordered.   Follow-Up: At [facility], you and your health needs are our priority.  As part of our continuing mission to provide you with exceptional heart care, we have created designated Provider Care Teams.  These Care Teams include your primary Cardiologist (physician) and Advanced Practice Providers (APPs -  Physician Assistants and Nurse Practitioners) who all work together to provide you with the care you need, when you need it.  We recommend signing up for the patient portal called "MyChart".  Sign up information is provided on this After Visit Summary.  MyChart is used to connect with patients for Virtual Visits (Telemedicine).  Patients are able to view lab/test results, encounter notes, upcoming appointments, etc.  Non-urgent messages can be sent to your provider as well.   To learn more about what you can do with MyChart, go to [email or web address].    Your next appointment:   1 year(s)  Provider:   Chilton Si, MD or Lebron Conners   Other Instructions  Your physician wants you to follow-up in: 1 year.  You will receive a reminder letter in the mail two months in advance. If you don't receive a letter, please call our office to schedule the follow-up appointment.  Tackling Obesity with  Lifestyle Changes  Obesity- What is it? And What can we do about it?  Obesity is a chronic complex disease defined as excessive fat deposits that can have a negative effect on our health. It can lead to many other diseases including type 2 diabetes.  Weight gain occurs when the amount of energy (calories) we consume is greater than the amount we use.  When our energy output is greater than our energy input we lose weight. The basic concept is simple, but in reality, it's much more complicated.  Unfortunately, in some people, our bodies have many ways it can compensate when we try to eat less and move more which can prevent [place] from changing our weight. This can lead to some people having a much more difficult time losing weight even when they put healthy habits into practice. This can be frustrating. We want to focus on healthy habits, physical activity and how we feel, and less the number on the scale.  Food As Energy  Calories  Calories is just a unit of measurement for energy.  Counting calories is not required to lose weight but counting for a short period of time can:   help you learn good portion sizes   Learn what your true energy needs are.   Help you be more aware of your snacking or grazing habits  To help calculate how many calories you should be eating, the NIH has a great body weight planner calculator at BeverageBuggy.si  Types of Energy Expenditure  Basal Metabolic Rate (BMR) Energy that our  bodies use to preform everyday tasks. More muscle mass through resistance training can increase this a small amount  Thermic Effect of Food The amount of energy that it takes to breakdown the food we eat. This will be highest when we eat protein and fiber rich foods  Exercise Energy Expenditure The amount of energy used during formal exercise (walking, biking, weightlifting)  Non-exercise activity thermogenesis (NEAT) The amount of energy spent on activities that are not  formal exercise (standing, fidgeting). Therefore, it is not only important to do formal exercise but also move around throughout the day.  Managing The Meal  Macro nutrients (carbohydrates, fats and protein, fiber, water)  Micronutrients (vitamins, minerals)  Dietary Fiber  Benefits Examples Cautions  Soluble fiber  Decreases cholesterol  improve blood sugar control,  Feeds our gut bacteria  Allows [place] to feel fuller for longer so we eat less  fruits  oats  barley  legumes  peas  Beans  vegetables (broccoli) and root vegetables (carrots) Add fiber into your diet slowly and be sure to drink at least 8 cups of water a day. This will help limit gas, bloating, diarrhea, or constipation.  Insoluble Fiber  Improves digestive health by making stool easier to pass  Allows [place] to feel fuller for longer so we eat less  whole grains  nuts  seeds  skin of fruit  vegetables (green beans, zucchini, cauliflower)  Tricks to add more fiber to your diet   Add beans (pinto, kidney, lima, navy and garbanzo) to salads, ground meat or brown rice   Add nuts or seeds and or fresh/frozen fruit to yogurt, cottage cheese, salads or steel cut oats   Cut up vegetables and eat with hummus   Look for unsweetened whole grain cereals with at least 5g of fiber per serving   Switch to whole grain bread. Look for bread that has whole grain flour as the first ingredient and has more fiber than carbs if you were to multiple the fiber x 10.   Try bulgar, barely, quinoa, buckwheat, brown rice wild rice instead of white rice   Keep frozen vegetables on hand to add to dishes or soups  Meal Planning:  Meal planning is the key to setting you up for success. Here are some examples of healthy meal options.  Breakfast  Option 1: Omelette with vegetables (1 egg, spinach, mushrooms, or other vegetable of your choice), 2 slices whole-grain toast, tip of thumb size butter or soft margarine,  cup low-fat  milk or yogurt  Option 2: steel-cut rolled oats (? cup dry), 1 tbsp peanut butter added to cooked oats,  cup low-fat milk.  Option 3: 2 slices whole-grain or rye toast with avocado spread ( small avocado mased with herbs and pepper to taste), 1 poached egg or [place] up (cooked to your liking)  Option 4:  cup plain 0% [place] yogurt topped with  cup berries and  cup walnuts or almonds, 2 slices whole-grain or rye toast, tip of thumb size soft margarine/butter  Lunch:  Option 1: 2 cups red lentil soup, green salad with 1 tbsp homemade vinaigrette (extra virgin olive oil and vinegar of choice plus spices)  Option 2: 3 oz. roasted chicken, 2 slices whole-grain bread, 2 tsp mayonnaise, mustard, lettuce, tomato if desired, 1 fruit (example: medium-sized apple or small pear)  Option 3: 3 oz. tuna packed in water, 1 whole-wheat pita (6 inch), 2 tsp mayonnaise, lettuce, tomato, or other non-starchy vegetable of your choice, 1 fruit (  example: medium-sized apple or small pear)  Option 4: 1 serving of garden veggie buddha bowl with lentils and tahini sauce and 1 cup berries topped with  cup plain 0% Greek yogurt  Dinner:  Option 1: 1 serving roasted cauliflower salad, 3-4 oz. grilled or baked pork loin chop, 1/2 cup mashed potato, or brown rice or quinoa  Option 2: 1 serving fish (baked, grilled or air fried), green salad, 1 tbsp homemade vinaigrette,  cup cooked couscous  Option 3: 1 cup cooked whole grained pasta (example: spaghetti, spirals, macaroni),  cup favorite pasta sauce (preferably homemade), 3-4 oz. grilled or baked chicken, green salad, 1 tbsp homemade vinaigrette  Option 4: 1 serving oven roasted salmon,  cup mashed sweet potato or couscous or brown rice or quinoa, broccoli (steamed or roasted)  Healthy snacks:   Carrots or celery with 1 tbsp of hummus   1 medium-sized fruit (apple or orange)   1 cup plain 0% [place] yogurt with  cup berries   Half apple, sliced,  with 1 tbsp (15 mL) peanut or almond butter  Dining out:  Eating away from home has become a part of many people's lifestyle. Making healthy choices when you are eating out is important too. Portion size is an important part of healthy choices. Most branded fast-food places provide calories, sodium, and fat content for their menu items. www.calorieking.com would be great resource to find nutrition facts for your favorite brands and fast-food restaurants. Company specific website can be Chief Technology Officer for nutrition information for their items. (e.g. www.mcdonalds.com or www.nutritionix.com/[place]/menu/premium)  Here are some tips to help you make wise food choices when you are dining out.  Chose more often Avoid  Beverages  Water, low fat milk  Sugar-free/diet drinks  Unsweet tea or coffee  Milkshakes, fruit drinks, regular pop  Alcohol, specialty drinks (e.g. iced cappuccino)  Fast food  Garden salad  Mini subs, pita sandwiches ect with extra vegetables  plain burgers, grilled chicken  Vegetarian or cheese pizza with whole-grain crust  Burgers/sandwiches with bacon, cheese, and high-fat sauces  [place] fries, fried chicken, fried fish, poutine, hash browns  Pizza with processed meats  Starters  Raw vegetables, salads (garden, spinach, fruit)  clear or vegetable soups  Seafood cocktail  Whole-grain breads and rolls  Salads with high-fat dressings or toppings  Creamy soups  Wings, egg rolls  onion rings, nachos  White or garlic bread  Main courses Grains & Starches (amount equal to  of your plate)  Oatmeal, high-fiber/lower-sugar cereals  Whole-grain breads, rice, pasta, barley, couscous  Sweet potatoes  Sugary, low-fiber cereals  Large bagels, muffins, croissants, white bread  [place] fries, hash browns, fried rice Meat and alternative (amount equal to  of your plate)  Lean meats, poultry, fish, eggs, low-fat cheese  Tofu, vegetable protein Legumes (e.g. lentils,  chickpeas, beans)  High-salt and/or high-fat meats (e.g. ribs, wings, sausages, wieners, processed lunch meats, imposter meats) Vegetables (amount equal to  of your plate)  Salads ([place], garden, spinach), plain vegetables  Salads with creamy, high-fat dressings and   Vegetables on sandwiches ect toppings like bacon bits, croutons, cheese  Desserts  Fresh fruit, frozen yogourt, skim milk latte  Cakes, pies, pastries, ice cream, cheesecake Tackling Obesity with Lifestyle Changes  Obesity- What is it? And What can we do about it?  Obesity is a chronic complex disease defined as excessive fat deposits that can have a negative effect on our health. It can lead to many other diseases including type  2 diabetes.  Weight gain occurs when the amount of energy (calories) we consume is greater than the amount we use.  When our energy output is greater than our energy input we lose weight. The basic concept is simple, but in reality, it's much more complicated.  Unfortunately, in some people, our bodies have many ways it can compensate when we try to eat less and move more which can prevent [place] from changing our weight. This can lead to some people having a much more difficult time losing weight even when they put healthy habits into practice. This can be frustrating. We want to focus on healthy habits, physical activity and how we feel, and less the number on the scale.  Food As Energy  Calories  Calories is just a unit of measurement for energy.  Counting calories is not required to lose weight but counting for a short period of time can:   help you learn good portion sizes   Learn what your true energy needs are.   Help you be more aware of your snacking or grazing habits  To help calculate how many calories you should be eating, the NIH has a great body weight planner calculator at BeverageBuggy.si  Types of Energy Expenditure  Basal Metabolic Rate (BMR) Energy that our bodies  use to preform everyday tasks. More muscle mass through resistance training can increase this a small amount  Thermic Effect of Food The amount of energy that it takes to breakdown the food we eat. This will be highest when we eat protein and fiber rich foods  Exercise Energy Expenditure The amount of energy used during formal exercise (walking, biking, weightlifting)  Non-exercise activity thermogenesis (NEAT) The amount of energy spent on activities that are not formal exercise (standing, fidgeting). Therefore, it is not only important to do formal exercise but also move around throughout the day.  Managing The Meal  Macro nutrients (carbohydrates, fats and protein, fiber, water)  Micronutrients (vitamins, minerals)  Dietary Fiber  Benefits Examples Cautions  Soluble fiber  Decreases cholesterol  improve blood sugar control,  Feeds our gut bacteria  Allows [place] to feel fuller for longer so we eat less  fruits  oats  barley  legumes  peas  Beans  vegetables (broccoli) and root vegetables (carrots) Add fiber into your diet slowly and be sure to drink at least 8 cups of water a day. This will help limit gas, bloating, diarrhea, or constipation.  Insoluble Fiber  Improves digestive health by making stool easier to pass  Allows [place] to feel fuller for longer so we eat less  whole grains  nuts  seeds  skin of fruit  vegetables (green beans, zucchini, cauliflower)  Tricks to add more fiber to your diet   Add beans (pinto, kidney, lima, navy and garbanzo) to salads, ground meat or brown rice   Add nuts or seeds and or fresh/frozen fruit to yogurt, cottage cheese, salads or steel cut oats   Cut up vegetables and eat with hummus   Look for unsweetened whole grain cereals with at least 5g of fiber per serving   Switch to whole grain bread. Look for bread that has whole grain flour as the first ingredient and has more fiber than carbs if you were to multiple the fiber x  10.   Try bulgar, barely, quinoa, buckwheat, brown rice wild rice instead of white rice   Keep frozen vegetables on hand to add to dishes or soups  Meal Planning:  Meal planning is the key to setting you up for success. Here are some examples of healthy meal options.  Breakfast  Option 1: Omelette with vegetables (1 egg, spinach, mushrooms, or other vegetable of your choice), 2 slices whole-grain toast, tip of thumb size butter or soft margarine,  cup low-fat milk or yogurt  Option 2: steel-cut rolled oats (? cup dry), 1 tbsp peanut butter added to cooked oats,  cup low-fat milk.  Option 3: 2 slices whole-grain or rye toast with avocado spread ( small avocado mased with herbs and pepper to taste), 1 poached egg or [place] up (cooked to your liking)  Option 4:  cup plain 0% [place] yogurt topped with  cup berries and  cup walnuts or almonds, 2 slices whole-grain or rye toast, tip of thumb size soft margarine/butter  Lunch:  Option 1: 2 cups red lentil soup, green salad with 1 tbsp homemade vinaigrette (extra virgin olive oil and vinegar of choice plus spices)  Option 2: 3 oz. roasted chicken, 2 slices whole-grain bread, 2 tsp mayonnaise, mustard, lettuce, tomato if desired, 1 fruit (example: medium-sized apple or small pear)  Option 3: 3 oz. tuna packed in water, 1 whole-wheat pita (6 inch), 2 tsp mayonnaise, lettuce, tomato, or other non-starchy vegetable of your choice, 1 fruit (example: medium-sized apple or small pear)  Option 4: 1 serving of garden veggie buddha bowl with lentils and tahini sauce and 1 cup berries topped with  cup plain 0% Greek yogurt  Dinner:  Option 1: 1 serving roasted cauliflower salad, 3-4 oz. grilled or baked pork loin chop, 1/2 cup mashed potato, or brown rice or quinoa  Option 2: 1 serving fish (baked, grilled or air fried), green salad, 1 tbsp homemade vinaigrette,  cup cooked couscous  Option 3: 1 cup cooked whole grained pasta (example:  spaghetti, spirals, macaroni),  cup favorite pasta sauce (preferably homemade), 3-4 oz. grilled or baked chicken, green salad, 1 tbsp homemade vinaigrette  Option 4: 1 serving oven roasted salmon,  cup mashed sweet potato or couscous or brown rice or quinoa, broccoli (steamed or roasted)  Healthy snacks:   Carrots or celery with 1 tbsp of hummus   1 medium-sized fruit (apple or orange)   1 cup plain 0% [place] yogurt with  cup berries   Half apple, sliced, with 1 tbsp (15 mL) peanut or almond butter  Dining out:  Eating away from home has become a part of many people's lifestyle. Making healthy choices when you are eating out is important too. Portion size is an important part of healthy choices. Most branded fast-food places provide calories, sodium, and fat content for their menu items. www.calorieking.com would be great resource to find nutrition facts for your favorite brands and fast-food restaurants. Company specific website can be Chief Technology Officer for nutrition information for their items. (e.g. www.mcdonalds.com or www.nutritionix.com/[place]/menu/premium)  Here are some tips to help you make wise food choices when you are dining out.  Chose more often Avoid  Beverages  Water, low fat milk  Sugar-free/diet drinks  Unsweet tea or coffee  Milkshakes, fruit drinks, regular pop  Alcohol, specialty drinks (e.g. iced cappuccino)  Fast food  Garden salad  Mini subs, pita sandwiches ect with extra vegetables  plain burgers, grilled chicken  Vegetarian or cheese pizza with whole-grain crust  Burgers/sandwiches with bacon, cheese, and high-fat sauces  [place] fries, fried chicken, fried fish, poutine, hash browns  Pizza with processed meats  Starters  Raw vegetables, salads (garden, spinach, fruit)  clear or vegetable soups  Seafood cocktail  Whole-grain breads and rolls  Salads with high-fat dressings or toppings  Creamy soups  Wings, egg rolls  onion rings,  nachos  White or garlic bread  Main courses Grains & Starches (amount equal to  of your plate)  Oatmeal, high-fiber/lower-sugar cereals  Whole-grain breads, rice, pasta, barley, couscous  Sweet potatoes  Sugary, low-fiber cereals  Large bagels, muffins, croissants, white bread  [place] fries, hash browns, fried rice Meat and alternative (amount equal to  of your plate)  Lean meats, poultry, fish, eggs, low-fat cheese  Tofu, vegetable protein Legumes (e.g. lentils, chickpeas, beans)  High-salt and/or high-fat meats (e.g. ribs, wings, sausages, wieners, processed lunch meats, imposter meats) Vegetables (amount equal to  of your plate)  Salads ([place], garden, spinach), plain vegetables  Salads with creamy, high-fat dressings and   Vegetables on sandwiches ect toppings like bacon bits, croutons, cheese  Desserts  Fresh fruit, frozen yogourt, skim milk latte  Cakes, pies, pastries, ice cream, cheesecake   Adopting a Healthy Lifestyle.   Weight: Know what a healthy weight is for you (roughly BMI <25) and aim to maintain this. You can calculate your body mass index on your smart phone. Unfortunately, this is not the most accurate measure of healthy weight, but it is the simplest measurement to use. A more accurate measurement involves body scanning which measures lean muscle, fat tissue and bony density. We do not have this equipment at [facility].    Diet: Aim for 7+ servings of fruits and vegetables daily Limit animal fats in diet for cholesterol and heart health - choose grass fed whenever available Avoid highly processed foods (fast food burgers, tacos, fried chicken, pizza, hot dogs, french fries)  Saturated fat comes in the form of butter, lard, coconut oil, margarine, partially hydrogenated oils, and fat in meat. These increase your risk of cardiovascular disease.  Use healthy plant oils, such as olive, canola, soy, corn, sunflower and peanut.  Whole foods such as fruits, vegetables  and whole grains have fiber  Men need > 38 grams of fiber per day Women need > 25 grams of fiber per day  Load up on vegetables and fruits - one-half of your plate: Aim for color and variety, and remember that potatoes dont count. Go for whole grains - one-quarter of your plate: Whole wheat, barley, wheat berries, quinoa, oats, brown rice, and foods made with them. If you want pasta, go with whole wheat pasta. Protein power - one-quarter of your plate: Fish, chicken, beans, and nuts are all healthy, versatile protein sources. Limit red meat. You need carbohydrates for energy! The type of carbohydrate is more important than the amount. Choose carbohydrates such as vegetables, fruits, whole grains, beans, and nuts in the place of white rice, white pasta, potatoes (baked or fried), macaroni and cheese, cakes, cookies, and donuts.  If youre thirsty, drink water. Coffee and tea are good in moderation, but skip sugary drinks and limit milk and dairy products to one or two daily servings. Keep sugar intake at 6 teaspoons or 24 grams or LESS       Exercise: Aim for 150 min of moderate intensity exercise weekly for heart health, and weights twice weekly for bone health Stay active - any steps are better than no steps! Aim for 7-9 hours of sleep daily             Mediterranean Diet  Why follow it? Research shows. Those who follow the Mediterranean diet  have a reduced risk of heart disease  The diet is associated with a reduced incidence of Parkinson's and Alzheimer's diseases People following the diet may have longer life expectancies and lower rates of chronic diseases  The Dietary Guidelines for Americans recommends the Mediterranean diet as an eating plan to promote health and prevent disease  What Is the Mediterranean Diet?  Healthy eating plan based on typical foods and recipes of Mediterranean-style cooking The diet is primarily a plant based diet; these foods should make up a majority of  meals   Starches - Plant based foods should make up a majority of meals - They are an important sources of vitamins, minerals, energy, antioxidants, and fiber - Choose whole grains, foods high in fiber and minimally processed items  - Typical grain sources include wheat, oats, barley, corn, brown rice, bulgar, farro, millet, polenta, couscous  - Various types of beans include chickpeas, lentils, fava beans, black beans, white beans   Fruits  Veggies - Large quantities of antioxidant rich fruits & veggies; 6 or more servings  - Vegetables can be eaten raw or lightly drizzled with oil and cooked  - Vegetables common to the traditional Mediterranean Diet include: artichokes, arugula, beets, broccoli, brussel sprouts, cabbage, carrots, celery, collard greens, cucumbers, eggplant, kale, leeks, lemons, lettuce, mushrooms, okra, onions, peas, peppers, potatoes, pumpkin, radishes, rutabaga, shallots, spinach, sweet potatoes, turnips, zucchini - Fruits common to the Mediterranean Diet include: apples, apricots, avocados, cherries, clementines, dates, figs, grapefruits, grapes, melons, nectarines, oranges, peaches, pears, pomegranates, strawberries, tangerines  Fats - Replace butter and margarine with healthy oils, such as olive oil, canola oil, and tahini  - Limit nuts to no more than a handful a day  - Nuts include walnuts, almonds, pecans, pistachios, pine nuts  - Limit or avoid candied, honey roasted or heavily salted nuts - Olives are central to the [employer] - can be eaten whole or used in a variety of dishes   Meats Protein - Limiting red meat: no more than a few times a month - When eating red meat: choose lean cuts and keep the portion to the size of deck of cards - Eggs: approx. 0 to 4 times a week  - Fish and lean poultry: at least 2 a week  - Healthy protein sources include, chicken, [place], lean beef, lamb - Increase intake of seafood such as tuna, salmon, trout, mackerel, shrimp,  scallops - Avoid or limit high fat processed meats such as sausage and bacon  Dairy - Include moderate amounts of low fat dairy products  - Focus on healthy dairy such as fat free yogurt, skim milk, low or reduced fat cheese - Limit dairy products higher in fat such as whole or 2% milk, cheese, ice cream  Alcohol - Moderate amounts of red wine is ok  - No more than 5 oz daily for women (all ages) and men older than age 1  - No more than 10 oz of wine daily for men younger than 42  Other - Limit sweets and other desserts  - Use herbs and spices instead of salt to flavor foods  - Herbs and spices common to the traditional Mediterranean Diet include: basil, bay leaves, chives, cloves, cumin, fennel, garlic, lavender, marjoram, mint, oregano, parsley, pepper, rosemary, sage, savory, sumac, tarragon, thyme   It's not just a diet, it's a lifestyle:  The Mediterranean diet includes lifestyle factors typical of those in the region  Foods, drinks and meals are best eaten with  others and savored Daily physical activity is important for overall good health This could be strenuous exercise like running and aerobics This could also be more leisurely activities such as walking, housework, yard-work, or taking the stairs Moderation is the key; a balanced and healthy diet accommodates most foods and drinks Consider portion sizes and frequency of consumption of certain foods   Meal Ideas & Options:  Breakfast:  Whole wheat toast or whole wheat English muffins with peanut butter & hard boiled egg Steel cut oats topped with apples & cinnamon and skim milk  Fresh fruit: banana, strawberries, melon, berries, peaches  Smoothies: strawberries, bananas, greek yogurt, peanut butter Low fat greek yogurt with blueberries and granola  Egg white omelet with spinach and mushrooms Breakfast couscous: whole wheat couscous, apricots, skim milk, cranberries  Sandwiches:  Hummus and grilled vegetables (peppers,  zucchini, squash) on whole wheat bread   Grilled chicken on whole wheat pita with lettuce, tomatoes, cucumbers or tzatziki  [place] salad on whole wheat bread: tuna salad made with greek yogurt, olives, red peppers, capers, green onions Garlic rosemary lamb pita: lamb sauted with garlic, rosemary, salt & pepper; add lettuce, cucumber, greek yogurt to pita - flavor with lemon juice and black pepper  Seafood:  Mediterranean grilled salmon, seasoned with garlic, basil, parsley, lemon juice and black pepper Shrimp, lemon, and spinach whole-grain pasta salad made with low fat greek yogurt  Seared scallops with lemon orzo  Seared tuna steaks seasoned salt, pepper, coriander topped with tomato mixture of olives, tomatoes, olive oil, minced garlic, parsley, green onions and cappers  Meats:  Herbed greek chicken salad with kalamata olives, cucumber, feta  Red bell peppers stuffed with spinach, bulgur, lean ground beef (or lentils) & topped with feta   Kebabs: skewers of chicken, tomatoes, onions, zucchini, squash  [place] burgers: made with red onions, mint, dill, lemon juice, feta cheese topped with roasted red peppers Vegetarian Cucumber salad: cucumbers, artichoke hearts, celery, red onion, feta cheese, tossed in olive oil & lemon juice  Hummus and whole grain pita points with a greek salad (lettuce, tomato, feta, olives, cucumbers, red onion) Lentil soup with celery, carrots made with vegetable broth, garlic, salt and pepper  Tabouli salad: parsley, bulgur, mint, scallions, cucumbers, tomato, radishes, lemon juice, olive oil, salt and pepper.

## 2023-04-19 ENCOUNTER — Ambulatory Visit
Admission: RE | Admit: 2023-04-19 | Discharge: 2023-04-19 | Disposition: A | Discharge status: Home / Self Care | Payer: BC Managed Care – PPO | Source: Ambulatory Visit

## 2023-04-19 ENCOUNTER — Ambulatory Visit: Payer: BC Managed Care – PPO

## 2023-04-19 VITALS — BP 136/73 | HR 83 | Temp 97.9°F | Resp 18

## 2023-04-19 DIAGNOSIS — S39011A Strain of muscle, fascia and tendon of abdomen, initial encounter: Secondary | ICD-10-CM

## 2023-04-19 DIAGNOSIS — J22 Unspecified acute lower respiratory infection: Secondary | ICD-10-CM

## 2023-04-19 MED ORDER — DOXYCYCLINE HYCLATE 100 MG PO CAPS: 100.0000 mg | ORAL_CAPSULE | Freq: Two times a day (BID) | ORAL | 0 refills | Status: DC

## 2023-04-19 MED ORDER — PROMETHAZINE-DM 6.25-15 MG/5ML PO SYRP: 5.0000 mL | ORAL_SOLUTION | Freq: Four times a day (QID) | ORAL | 0 refills | Status: DC | PRN

## 2023-04-19 NOTE — ED Provider Notes (Signed)
[facility]    CSN: [id number] Arrival date & time: 04/19/23  1745      History   Chief Complaint Chief Complaint  Patient presents with   Cough    Had this cough for over 3 months. It's mainly a dry cough. Now I have pain in my right side. - Entered by patient    HPI Robin Gardner is a 57 y.o. female.   Presenting today with 25-month history of ongoing and worsening productive cough, chest tightness at times.  She denies fever, chills, chest pain, sore throat, nasal congestion.  States she is awaiting a pulmonology appointment in the next month or so set up by her primary care who thinks she may have asthma but states there was never any testing run or medications offered.  Her PCP gave her Jerilynn Som for ongoing cough but she notes this has not helped.  Has been taking Mucinex for weeks at a time with no relief.  Is now having some dull pain to the right mid abdomen region worse with coughing fits or movement and is wondering if she pulled a muscle.  Denies associated nausea, vomiting, diarrhea, urinary symptoms, fever.    Past Medical History:  Diagnosis Date   Anxiety    Arthritis    psoriatic - RA   Depression    Essential hypertension, benign 05/20/2013   Fibromyalgia 2020   GERD (gastroesophageal reflux disease)    Hypothyroidism    Seasonal allergies    Skin cancer    Ulcerative colitis (HCC)    resolved in 2018    Patient Active Problem List   Diagnosis Date Noted   Posterior vitreous detachment, both eyes 07/05/2020   Vitreous floaters of both eyes 07/05/2020   Hyperlipidemia LDL goal <100 08/20/2017   Ulcerative colitis (HCC) 10/05/2013   Polyarthritis 10/05/2013   Depression 10/05/2013   Essential hypertension, benign 05/20/2013   GERD (gastroesophageal reflux disease) 11/09/2012   Hypothyroidism 11/09/2012    Past Surgical History:  Procedure Laterality Date   ABDOMINAL HYSTERECTOMY     AIKEN OSTEOTOMY Right 11/27/2012    Procedure: Quintella Reichert OSTEOTOMY RIGHT FOOT;  Surgeon: Dallas Schimke, DPM;  Location: [facility];  Service: Orthopedics;  Laterality: Right;   Quintella Reichert OSTEOTOMY Left 04/23/2013   Procedure: Quintella Reichert OSTEOTOMY;  Surgeon: Dallas Schimke, DPM;  Location: [facility];  Service: Orthopedics;  Laterality: Left;   BLADDER SUSPENSION  2012   BUNIONECTOMY Left 04/23/2013   Procedure: Serafina Royals;  Surgeon: Dallas Schimke, DPM;  Location: [facility];  Service: Orthopedics;  Laterality: Left;   CESAREAN SECTION  2009   CHOLECYSTECTOMY N/A 03/24/2020   Procedure: LAPAROSCOPIC CHOLECYSTECTOMY WITH INTRAOPERATIVE CHOLANGIOGRAM;  Surgeon: Gaynelle Adu, MD;  Location: [facility];  Service: General;  Laterality: N/A;   COLONOSCOPY     DILATION AND CURETTAGE OF UTERUS     EYE SURGERY Bilateral 2020,2020   cataract   OSTECTOMY Right 11/27/2012   Procedure: LUDLOFF OSTEOTOMY RIGHT FOOT;  Surgeon: Dallas Schimke, DPM;  Location: [facility];  Service: Orthopedics;  Laterality: Right;    OB History   No obstetric history on file.      Home Medications    Prior to Admission medications   Medication Sig Start Date End Date Taking? Authorizing Provider  benzonatate (TESSALON) 200 MG capsule Take 200 mg by mouth 3 (three) times daily. 04/17/23  Yes [provider]  doxycycline (VIBRAMYCIN) 100 MG capsule Take 1 capsule (100 mg total) by mouth  2 (two) times daily. 04/19/23  Yes Particia Nearing, PA-C  promethazine-dextromethorphan (PROMETHAZINE-DM) 6.25-15 MG/5ML syrup Take 5 mLs by mouth 4 (four) times daily as needed. 04/19/23  Yes Particia Nearing, PA-C  amLODipine (NORVASC) 2.5 MG tablet TAKE 1 TABLET BY MOUTH ONCE DAILY. Patient taking differently: Take 2.5 mg by mouth daily. 06/02/18   Merlyn Albert, MD  Biotin 60454 MCG TABS Take 10,000 mcg by mouth daily.    [provider]  Cholecalciferol (VITAMIN D) 125 MCG (5000 UT) CAPS Take 5,000 Units by mouth daily.      [provider]  diclofenac Sodium (VOLTAREN) 1 % GEL Apply 2 g topically daily as needed (pain).    [provider]  folic acid (FOLVITE) 1 MG tablet Take 1 mg by mouth daily.  07/30/16   [provider]  levothyroxine (SYNTHROID) 88 MCG tablet Take 88 mcg by mouth daily before breakfast. 02/05/20   [provider]  methotrexate (RHEUMATREX) 2.5 MG tablet Take 10 mg by mouth once a week.  07/30/16   [provider]  pantoprazole (PROTONIX) 40 MG tablet TAKE ONE TABLET BY MOUTH TWICE DAILY. Patient taking differently: Take 40 mg by mouth 2 (two) times daily. 11/04/18   Merlyn Albert, MD  pravastatin (PRAVACHOL) 20 MG tablet TAKE 1 TABLET BY MOUTH ONCE A DAY. Patient taking differently: Take 20 mg by mouth daily. 06/02/18   Merlyn Albert, MD  sertraline (ZOLOFT) 100 MG tablet Take 1 tablet (100 mg total) by mouth daily. Patient taking differently: Take 100 mg by mouth in the morning and at bedtime. 03/10/18   Merlyn Albert, MD  triamcinolone cream (KENALOG) 0.1 % Apply 1 application topically daily as needed (itching).    [provider]    Family History Family History  Problem Relation Age of Onset   Coronary artery disease Mother        CABG x 4 age 16s   Hypertension Father        brain bleed   Heart attack Father    CAD Father     Social History Social History   Tobacco Use   Smoking status: Never   Smokeless tobacco: Never  Vaping Use   Vaping status: Never Used  Substance Use Topics   Alcohol use: Yes    Alcohol/week: 1.0 standard drink of alcohol    Types: 1 Glasses of wine per week    Comment: rarely   Drug use: No     Allergies   Advil [ibuprofen], Augmentin [amoxicillin-pot clavulanate], Biaxin [clarithromycin], Chlorhexidine, Adhesive [tape], Aspirin, Latex, and Vasotec [enalapril]   Review of Systems Review of Systems Per HPI  Physical Exam Triage Vital Signs ED Triage Vitals  Encounter Vitals  Group     BP 04/19/23 1749 136/73     Systolic BP Percentile --      Diastolic BP Percentile --      Pulse Rate 04/19/23 1749 83     Resp 04/19/23 1749 18     Temp 04/19/23 1749 97.9 F (36.6 C)     Temp Source 04/19/23 1749 Oral     SpO2 04/19/23 1749 95 %     Weight --      Height --      Head Circumference --      Peak Flow --      Pain Score 04/19/23 1750 6     Pain Loc --      Pain Education --  Exclude from Growth Chart --    No data found.  Updated Vital Signs BP 136/73 (BP Location: Right Arm)   Pulse 83   Temp 97.9 F (36.6 C) (Oral)   Resp 18   SpO2 95%   Visual Acuity Right Eye Distance:   Left Eye Distance:   Bilateral Distance:    Right Eye Near:   Left Eye Near:    Bilateral Near:     Physical Exam Vitals and nursing note reviewed.  Constitutional:      Appearance: Normal appearance.  HENT:     Head: Atraumatic.     Right Ear: Tympanic membrane and external ear normal.     Left Ear: Tympanic membrane and external ear normal.     Nose: Nose normal.     Mouth/Throat:     Mouth: Mucous membranes are moist.  Eyes:     Extraocular Movements: Extraocular movements intact.     Conjunctiva/sclera: Conjunctivae normal.  Cardiovascular:     Rate and Rhythm: Normal rate and regular rhythm.     Heart sounds: Normal heart sounds.  Pulmonary:     Effort: Pulmonary effort is normal.     Breath sounds: Normal breath sounds. No wheezing or rales.  Abdominal:     General: Bowel sounds are normal. There is no distension.     Palpations: Abdomen is soft.     Tenderness: There is abdominal tenderness.     Comments: Mild tenderness to palpation to the right mid abdomen laterally  Musculoskeletal:        General: Normal range of motion.     Cervical back: Normal range of motion and neck supple.  Skin:    General: Skin is warm and dry.  Neurological:     Mental Status: She is alert and oriented to person, place, and time.  Psychiatric:        Mood and  Affect: Mood normal.        Thought Content: Thought content normal.      UC Treatments / Results  Labs (all labs ordered are listed, but only abnormal results are displayed) Labs Reviewed - No data to display  EKG   Radiology DG Chest 2 View Result Date: 04/19/2023 CLINICAL DATA:  Three months of cough EXAM: CHEST - 2 VIEW COMPARISON:  11/16/2013 FINDINGS: Right upper lobe heterogeneous airspace disease consistent with pneumonia. No pleural effusion. Normal cardiac size. No pneumothorax IMPRESSION: Right upper lobe pneumonia. Radiographic follow-up to resolution is recommended. Electronically Signed   By: Jasmine Pang M.D.   On: 04/19/2023 19:10    Procedures Procedures (including critical care time)  Medications Ordered in UC Medications - No data to display  Initial Impression / Assessment and Plan / UC Course  I have reviewed the triage vital signs and the nursing notes.  Pertinent labs & imaging results that were available during my care of the patient were reviewed by me and considered in my medical decision making (see chart for details).     Chest x-ray today showing some right upper lobe pneumonia.  Will treat with doxycycline, Phenergan DM, Mucinex and supportive home care.  Suspect her abdominal pain is related to an abdominal muscle strain.  Discussed over-the-counter pain relievers, heat, massage, rest and return precautions for worsening symptoms.  Final Clinical Impressions(s) / UC Diagnoses   Final diagnoses:  Lower respiratory infection  Strain of abdominal muscle, initial encounter   Discharge Instructions   None    ED Prescriptions  Medication Sig Dispense Auth. Provider   doxycycline (VIBRAMYCIN) 100 MG capsule Take 1 capsule (100 mg total) by mouth 2 (two) times daily. 14 capsule Particia Nearing, [place]   promethazine-dextromethorphan (PROMETHAZINE-DM) 6.25-15 MG/5ML syrup Take 5 mLs by mouth 4 (four) times daily as needed. 100 mL Particia Nearing, [place]      PDMP not reviewed this encounter.   Particia Nearing, [place] 04/19/23 1930

## 2023-04-19 NOTE — ED Triage Notes (Signed)
Pt reports she has some from right side pain x 1 day and a cough x 3 months

## 2023-05-19 NOTE — Progress Notes (Signed)
 Robin Gardner, female    DOB: Aug 10, 1965    MRN: [id number]   Brief patient profile:  77  yowf  never smoker with UC since 20s then RA  referred to pulmonary clinic in [place]  05/21/2023 by Robin Gardner  for cough p viral infection NOS in Sept 2024     History of Present Illness  05/21/2023  Pulmonary/ 1st Gardner eval/ Robin Gardner / Robin Gardner   Chief Complaint  Patient presents with   Establish Care   Cough  Dyspnea:  more sob even if not coughing  Cough: rattling  worse at hs and in am >  clear to yellow no bloody Cough to point of vomit / no urinary incont   Sleep: bed is flat / one pillow s resp cc  SABA use: none 02: none     No obvious day to day or daytime pattern/variability or assoc excess/ purulent sputum or mucus plugs or hemoptysis or cp or chest tightness, subjective wheeze or overt sinus or hb symptoms.    Also denies any obvious fluctuation of symptoms with weather or environmental changes or other aggravating or alleviating factors except as outlined above   No unusual exposure hx or h/o childhood pna/ asthma or knowledge of premature birth.  Current Allergies, Complete Past Medical History, Past Surgical History, Family History, and Social History were reviewed in [employer] record.  ROS  The following are not active complaints unless bolded Hoarseness, sore throat, dysphagia/globus , dental problems, itching, sneezing,  nasal congestion or discharge of excess mucus or purulent secretions, ear ache,   fever, chills, sweats, unintended wt loss or wt gain, classically pleuritic or exertional cp,  orthopnea pnd or arm/hand swelling  or leg swelling, presyncope, palpitations, abdominal pain, anorexia, nausea, vomiting, diarrhea  or change in bowel habits or change in bladder habits, change in stools or change in urine, dysuria, hematuria,  rash, arthralgias, visual complaints, headache, numbness, weakness or ataxia or problems with walking or  coordination,  change in mood or  memory.            Outpatient Medications Prior to Visit  Medication Sig Dispense Refill   amLODipine  (NORVASC ) 2.5 MG tablet TAKE 1 TABLET BY MOUTH ONCE DAILY. (Patient taking differently: Take 2.5 mg by mouth daily.) 90 tablet 0   Biotin 89999 MCG TABS Take 10,000 mcg by mouth daily.     buPROPion (WELLBUTRIN XL) 300 MG 24 hr tablet Take 300 mg by mouth daily.     Cholecalciferol (VITAMIN D) 125 MCG (5000 UT) CAPS Take 5,000 Units by mouth daily.      diclofenac Sodium (VOLTAREN) 1 % GEL Apply 2 g topically daily as needed (pain).     folic acid (FOLVITE) 1 MG tablet Take 1 mg by mouth daily.   2   HYDROCODONE -CHLORPHENIRAMINE PO Take 5 mLs by mouth 4 (four) times daily as needed.     levothyroxine  (SYNTHROID ) 88 MCG tablet Take 88 mcg by mouth daily before breakfast.     methotrexate (RHEUMATREX) 2.5 MG tablet Take 10 mg by mouth once a week.   2   pantoprazole  (PROTONIX ) 40 MG tablet TAKE ONE TABLET BY MOUTH TWICE DAILY. (Patient taking differently: Take 40 mg by mouth 2 (two) times daily.) 60 tablet 0   pravastatin  (PRAVACHOL ) 20 MG tablet TAKE 1 TABLET BY MOUTH ONCE A DAY. (Patient taking differently: Take 20 mg by mouth daily.) 90 tablet 0   rosuvastatin (CRESTOR) 20 MG tablet  Take 20 mg by mouth at bedtime.     sertraline  (ZOLOFT ) 100 MG tablet Take 1 tablet (100 mg total) by mouth daily. (Patient taking differently: Take 100 mg by mouth in the morning and at bedtime.) 90 tablet 1   triamcinolone  cream (KENALOG) 0.1 % Apply 1 application topically daily as needed (itching).     benzonatate  (TESSALON ) 200 MG capsule Take 200 mg by mouth 3 (three) times daily.     doxycycline  (VIBRAMYCIN ) 100 MG capsule Take 1 capsule (100 mg total) by mouth 2 (two) times daily. 14 capsule 0   promethazine -dextromethorphan (PROMETHAZINE -DM) 6.25-15 MG/5ML syrup Take 5 mLs by mouth 4 (four) times daily as needed. 100 mL 0   No facility-administered medications prior to  visit.    Past Medical History:  Diagnosis Date   Anxiety    Arthritis    psoriatic - RA   Depression    Essential hypertension, benign 05/20/2013   Fibromyalgia 2020   GERD (gastroesophageal reflux disease)    Hypothyroidism    Seasonal allergies    Skin cancer    Ulcerative colitis (HCC)    resolved in 2018      Objective:     BP 134/76   Pulse 84   Ht 5' 8 (1.727 m)   Wt 184 lb (83.5 kg)   SpO2 95%   BMI 27.98 kg/m   SpO2: 95 %  RA  Amb wf nad   HEENT : Oropharynx  clear        NECK :  without  apparent JVD/ palpable Nodes/TM    LUNGS: no acc muscle use,  Nl contour chest which is clear to A and P bilaterally without cough on insp or exp maneuvers   CV:  RRR  no s3 or murmur or increase in P2, and no edema   ABD:  soft and nontender   MS:  Gait nl  ext warm without deformities Or obvious joint restrictions  calf tenderness, cyanosis or clubbing    SKIN: warm and dry without lesions    NEURO:  alert, approp, nl sensorium with  no motor or cerebellar deficits apparent.    CXR PA and Lateral:   05/21/2023 :    I personally reviewed images and impression is as follows:     RUL AS disease has resolved      Assessment   Pulmonary infiltrates on CXR Onset of symptoms early Dec 2024 but coughing since Sept 2024 viral uri - Quant GOLD TB 05/21/2023  - ESR 05/21/2023  - CXR 05/21/2023 clear   CAP resolved, no further w/u   Chronic cough Never smoker with UC s/p viral URI Sept 2024 and coughing ever since  - Allergy screen 05/21/2023 >  Eos 0. /  IgE    Prolonged post viral cough consistent witho Upper airway cough syndrome (previously labeled PNDS),  is so named because it's frequently impossible to sort out how much is  CR/sinusitis with freq throat clearing (which can be related to primary GERD)   vs  causing  secondary ( extra esophageal)  GERD from wide swings in gastric pressure that occur with throat clearing, often  promoting self use of mint  and menthol lozenges that reduce the lower esophageal sphincter tone and exacerbate the problem further in a cyclical fashion.   These are the same pts (now being labeled as having irritable larynx syndrome by some cough centers) who not infrequently have a history of having failed to tolerate ace inhibitors,  dry powder inhalers or biphosphonates or report having atypical/extraesophageal reflux symptoms that don't respond to standard doses of PPI  and are easily confused as having aecopd or asthma flares by even experienced allergists/ pulmonologists (myself included).   Of the three most common causes of  Sub-acute / recurrent or chronic cough, only one (GERD)  can actually contribute to/ trigger  the other two (asthma and post nasal drip syndrome)  and perpetuate the cylce of cough.  While not intuitively obvious, many patients with chronic low grade reflux do not cough until there is a primary insult that disturbs the protective epithelial barrier and exposes sensitive nerve endings.   This is typically viral but can due to PNDS and  either may apply here.     The point is that once this occurs, it is difficult to eliminate the cycle  using anything but a maximally effective acid suppression regimen at least in the short run, accompanied by an appropriate diet to address non acid GERD and control / eliminate the cough itself for at least 3 days with hydrocodone  to supplement mucinex D which should help her rhinitis/ pnds with next step Sinus CT p 10 more days omnicef  300 mg bid  Discussed in detail all the  indications, usual  risks and alternatives  relative to the benefits with patient who agrees to proceed with w/u as outlined.            Each maintenance medication was reviewed in detail including emphasizing most importantly the difference between maintenance and prns and under what circumstances the prns are to be triggered using an action plan format where appropriate.  Total time for H  and P, chart review, counseling,  and generating customized AVS unique to this Gardner visit / same day charting  > 60 min complex pt new to me.           Ozell America, MD 05/21/2023

## 2023-05-21 ENCOUNTER — Ambulatory Visit (HOSPITAL_COMMUNITY)
Admission: RE | Admit: 2023-05-21 | Discharge: 2023-05-21 | Disposition: A | Discharge status: Home / Self Care | Payer: 59 | Source: Ambulatory Visit | Attending: Internal Medicine | Admitting: Internal Medicine

## 2023-05-21 ENCOUNTER — Encounter: Payer: Self-pay | Admitting: Internal Medicine

## 2023-05-21 ENCOUNTER — Ambulatory Visit: Payer: 59 | Admitting: Internal Medicine

## 2023-05-21 VITALS — BP 134/76 | HR 84 | Ht 68.0 in | Wt 184.0 lb

## 2023-05-21 DIAGNOSIS — R918 Other nonspecific abnormal finding of lung field: Secondary | ICD-10-CM

## 2023-05-21 DIAGNOSIS — R0989 Other specified symptoms and signs involving the circulatory and respiratory systems: Secondary | ICD-10-CM | POA: Diagnosis not present

## 2023-05-21 DIAGNOSIS — R053 Chronic cough: Secondary | ICD-10-CM | POA: Diagnosis not present

## 2023-05-21 DIAGNOSIS — J45909 Unspecified asthma, uncomplicated: Secondary | ICD-10-CM | POA: Insufficient documentation

## 2023-05-21 DIAGNOSIS — I10 Essential (primary) hypertension: Secondary | ICD-10-CM | POA: Insufficient documentation

## 2023-05-21 DIAGNOSIS — R059 Cough, unspecified: Secondary | ICD-10-CM | POA: Diagnosis not present

## 2023-05-21 MED ORDER — CEFDINIR 300 MG PO CAPS: 300.0000 mg | ORAL_CAPSULE | Freq: Two times a day (BID) | ORAL | 0 refills | Status: AC

## 2023-05-21 NOTE — Patient Instructions (Addendum)
 For cough / nasal congestion > Mucinex D 1200 12 hours (behind the counter)  and supplement with hydrocodone  as needed for excess cough (painful cough, cough to vomit or cough at night when you should be sleeping would all count as excess)   Omnicef  300 mg twice daily x 10 days  Please remember to go to the lab department   for your tests - we will call you with the results when they are available.     Please remember to go to the  x-ray department  @  [facility] for your tests - we will call you with the results when they are available     Please stay home or use the mask when you go in public for now   Please schedule a follow up office visit in 2 weeks, sooner if needed.

## 2023-05-22 DIAGNOSIS — R053 Chronic cough: Secondary | ICD-10-CM | POA: Insufficient documentation

## 2023-05-22 NOTE — Assessment & Plan Note (Signed)
 Never smoker with UC s/p viral URI Sept 2024 and coughing ever since  - Allergy screen 05/21/2023 >  Eos 0. /  IgE    Prolonged post viral cough consistent witho Upper airway cough syndrome (previously labeled PNDS),  is so named because it's frequently impossible to sort out how much is  CR/sinusitis with freq throat clearing (which can be related to primary GERD)   vs  causing  secondary (" extra esophageal")  GERD from wide swings in gastric pressure that occur with throat clearing, often  promoting self use of mint and menthol lozenges that reduce the lower esophageal sphincter tone and exacerbate the problem further in a cyclical fashion.   These are the same pts (now being labeled as having "irritable larynx syndrome" by some cough centers) who not infrequently have a history of having failed to tolerate ace inhibitors,  dry powder inhalers or biphosphonates or report having atypical/extraesophageal reflux symptoms that don't respond to standard doses of PPI  and are easily confused as having aecopd or asthma flares by even experienced allergists/ pulmonologists (myself included).   Of the three most common causes of  Sub-acute / recurrent or chronic cough, only one (GERD)  can actually contribute to/ trigger  the other two (asthma and post nasal drip syndrome)  and perpetuate the cylce of cough.  While not intuitively obvious, many patients with chronic low grade reflux do not cough until there is a primary insult that disturbs the protective epithelial barrier and exposes sensitive nerve endings.   This is typically viral but can due to PNDS and  either may apply here.     The point is that once this occurs, it is difficult to eliminate the cycle  using anything but a maximally effective acid suppression regimen at least in the short run, accompanied by an appropriate diet to address non acid GERD and control / eliminate the cough itself for at least 3 days with hydrocodone  to supplement mucinex D  which should help her rhinitis/ pnds with next step Sinus CT p 10 more days omnicef  300 mg bid  Discussed in detail all the  indications, usual  risks and alternatives  relative to the benefits with patient who agrees to proceed with w/u as outlined.            Each maintenance medication was reviewed in detail including emphasizing most importantly the difference between maintenance and prns and under what circumstances the prns are to be triggered using an action plan format where appropriate.  Total time for H and P, chart review, counseling,  and generating customized AVS unique to this office visit / same day charting  > 60 min complex pt new to me.

## 2023-05-22 NOTE — Assessment & Plan Note (Signed)
 Onset of symptoms early Dec 2024 but coughing since Sept 2024 viral uri - Quant GOLD TB 05/21/2023  - ESR 05/21/2023  - CXR 05/21/2023 clear   CAP resolved, no further w/u

## 2023-05-27 ENCOUNTER — Telehealth: Payer: Self-pay

## 2023-05-27 LAB — CBC WITH DIFFERENTIAL/PLATELET
Basophils Absolute: 0.1 10*3/uL (ref 0.0–0.2)
Basos: 1 %
EOS (ABSOLUTE): 0.2 10*3/uL (ref 0.0–0.4)
Eos: 2 %
Hematocrit: 41.1 % (ref 34.0–46.6)
Hemoglobin: 13.6 g/dL (ref 11.1–15.9)
Immature Grans (Abs): 0 10*3/uL (ref 0.0–0.1)
Immature Granulocytes: 0 %
Lymphocytes Absolute: 2.3 10*3/uL (ref 0.7–3.1)
Lymphs: 30 %
MCH: 30.4 pg (ref 26.6–33.0)
MCHC: 33.1 g/dL (ref 31.5–35.7)
MCV: 92 fL (ref 79–97)
Monocytes Absolute: 0.8 10*3/uL (ref 0.1–0.9)
Monocytes: 10 %
Neutrophils Absolute: 4.2 10*3/uL (ref 1.4–7.0)
Neutrophils: 57 %
Platelets: 274 10*3/uL (ref 150–450)
RBC: 4.48 x10E6/uL (ref 3.77–5.28)
RDW: 14.4 % (ref 11.7–15.4)
WBC: 7.5 10*3/uL (ref 3.4–10.8)

## 2023-05-27 LAB — IGE: IgE (Immunoglobulin E), Serum: 26 [IU]/mL (ref 6–495)

## 2023-05-27 LAB — QUANTIFERON-TB GOLD PLUS
QuantiFERON Mitogen Value: >10 [IU]/mL
QuantiFERON Nil Value: 0.02 [IU]/mL
QuantiFERON TB1 Ag Value: 0.06 [IU]/mL
QuantiFERON TB2 Ag Value: 0.05 [IU]/mL
QuantiFERON-TB Gold Plus: NEGATIVE

## 2023-05-27 LAB — SEDIMENTATION RATE: Sed Rate: 16 mm/h (ref 0–40)

## 2023-05-27 NOTE — Telephone Encounter (Signed)
-----   Message from Sandrea Hughs sent at 05/27/2023  6:26 AM EST ----- Call patient :  Studies are unremarkable, no change in recs

## 2023-05-27 NOTE — Telephone Encounter (Signed)
I left a message for the patient to return my call.

## 2023-05-28 DIAGNOSIS — Z6827 Body mass index (BMI) 27.0-27.9, adult: Secondary | ICD-10-CM | POA: Diagnosis not present

## 2023-05-28 DIAGNOSIS — Z23 Encounter for immunization: Secondary | ICD-10-CM | POA: Diagnosis not present

## 2023-05-28 DIAGNOSIS — B0229 Other postherpetic nervous system involvement: Secondary | ICD-10-CM | POA: Diagnosis not present

## 2023-05-28 DIAGNOSIS — F331 Major depressive disorder, recurrent, moderate: Secondary | ICD-10-CM | POA: Diagnosis not present

## 2023-05-28 DIAGNOSIS — K5190 Ulcerative colitis, unspecified, without complications: Secondary | ICD-10-CM | POA: Diagnosis not present

## 2023-05-28 DIAGNOSIS — E782 Mixed hyperlipidemia: Secondary | ICD-10-CM | POA: Diagnosis not present

## 2023-06-02 NOTE — Progress Notes (Unsigned)
Robin Gardner, female    DOB: 1965/10/12    MRN: [id number]   Brief patient profile:  67  yowf  never smoker with UC since 20s then RA  referred to pulmonary clinic in [place]  05/21/2023 by Dr Reuel Boom  for cough p viral infection NOS in Sept 2024    History of Present Illness  05/21/2023  Pulmonary/ 1st office eval/ B[facility] Dro[facility] / [facility]   Chief Complaint  Patient presents with   Establish Care   Cough  Dyspnea:  more sob even if not coughing  Cough: rattling  worse at hs and in am >  clear to yellow no bloody Cough to point of vomit / no urinary incont   Sleep: bed is flat / one pillow s resp cc  SABA use: none 02: none  Rec For cough / nasal congestion > Mucinex D 1200 12 hours (behind the counter)  and supplement with hydrocodone as needed for excess cough (painful cough, cough to vomit or cough at night when you should be sleeping would all count as excess)  Omnicef 300 mg twice daily x 10 days      06/03/2023  f/u ov/[place] office/Zacarias Krauter re:  cough p viral infection NOS in Sept 2024 maint on mucinex D bid   Chief Complaint  Patient presents with   Follow-up    2  week follow up for cough   Dyspnea:  with  activity more than slow pace  flat grade Cough: dry hacking day > noct, feels something stuck in throat  Sleeping: bed is flat  one pillow  SABA use: none  02: none      No obvious day to day or daytime variability or assoc excess/ purulent sputum or mucus plugs or hemoptysis or cp or chest tightness, subjective wheeze.     Also denies any obvious fluctuation of symptoms with weather or environmental changes or other aggravating or alleviating factors except as outlined above   No unusual exposure hx or h/o childhood pna/ asthma or knowledge of premature birth.  Current Allergies, Complete Past Medical History, Past Surgical History, Family History, and Social History were reviewed in [employer] record.  ROS  The following are  not active complaints unless bolded Hoarseness, sore throat= globus , dysphagia, dental problems, itching, sneezing,  nasal congestion or discharge of excess mucus or purulent secretions, ear ache,   fever, chills, sweats, unintended wt loss or wt gain, classically pleuritic or exertional cp,  orthopnea pnd or arm/hand swelling  or leg swelling, presyncope, palpitations, abdominal pain, anorexia, nausea, vomiting, diarrhea  or change in bowel habits or change in bladder habits, change in stools or change in urine, dysuria, hematuria,  rash, arthralgias, visual complaints, headache, numbness, weakness or ataxia or problems with walking or coordination,  change in mood or  memory.        Current Meds  Medication Sig   amLODipine (NORVASC) 2.5 MG tablet TAKE 1 TABLET BY MOUTH ONCE DAILY. (Patient taking differently: Take 2.5 mg by mouth daily.)   Biotin 78295 MCG TABS Take 10,000 mcg by mouth daily.   buPROPion (WELLBUTRIN XL) 300 MG 24 hr tablet Take 300 mg by mouth daily.   cefdinir (OMNICEF) 300 MG capsule Take 1 capsule (300 mg total) by mouth 2 (two) times daily.   Cholecalciferol (VITAMIN D) 125 MCG (5000 UT) CAPS Take 5,000 Units by mouth daily.    diclofenac Sodium (VOLTAREN) 1 % GEL Apply 2 g topically daily  as needed (pain).   folic acid (FOLVITE) 1 MG tablet Take 1 mg by mouth daily.    HYDROCODONE-CHLORPHENIRAMINE PO Take 5 mLs by mouth 4 (four) times daily as needed.   levothyroxine (SYNTHROID) 88 MCG tablet Take 88 mcg by mouth daily before breakfast.   methotrexate (RHEUMATREX) 2.5 MG tablet Take 10 mg by mouth once a week.    pantoprazole (PROTONIX) 40 MG tablet TAKE ONE TABLET BY MOUTH TWICE DAILY. (Patient taking differently: Take 40 mg by mouth 2 (two) times daily.)   pravastatin (PRAVACHOL) 20 MG tablet TAKE 1 TABLET BY MOUTH ONCE A DAY. (Patient taking differently: Take 20 mg by mouth daily.)   rosuvastatin (CRESTOR) 20 MG tablet Take 20 mg by mouth at bedtime.   sertraline  (ZOLOFT) 100 MG tablet Take 1 tablet (100 mg total) by mouth daily. (Patient taking differently: Take 100 mg by mouth in the morning and at bedtime.)   triamcinolone cream (KENALOG) 0.1 % Apply 1 application topically daily as needed (itching).           Past Medical History:  Diagnosis Date   Anxiety    Arthritis    psoriatic - RA   Depression    Essential hypertension, benign 05/20/2013   Fibromyalgia 2020   GERD (gastroesophageal reflux disease)    Hypothyroidism    Seasonal allergies    Skin cancer    Ulcerative colitis (HCC)    resolved in 2018      Objective:     Wt Readings from Last 3 Encounters:  06/03/23 179 lb (81.2 kg)  05/21/23 184 lb (83.5 kg)  02/27/23 183 lb 3.2 oz (83.1 kg)      Vital signs reviewed  06/03/2023  - Note at rest 02 sats  95% on RA   General appearance:    amb somber wf/ freq throat clearing and slt nasal tone to voice    HEENT : Oropharynx  minimal watery pnd/ min cobblestoning    Nasal turbinates nl    NECK :  without  apparent JVD/ palpable Nodes/TM    LUNGS: no acc muscle use,  Nl contour chest which is clear to A and P bilaterally without cough on insp or exp maneuvers   CV:  RRR  no s3 or murmur or increase in P2, and no edema   ABD:  soft and nontender   MS:  Gait nl   ext warm without deformities Or obvious joint restrictions  calf tenderness, cyanosis or clubbing    SKIN: warm and dry without lesions    NEURO:  alert, approp, nl sensorium with  no motor or cerebellar deficits apparent.        Assessment

## 2023-06-03 ENCOUNTER — Encounter: Payer: Self-pay | Admitting: Internal Medicine

## 2023-06-03 ENCOUNTER — Ambulatory Visit (INDEPENDENT_AMBULATORY_CARE_PROVIDER_SITE_OTHER): Payer: 59 | Admitting: Internal Medicine

## 2023-06-03 VITALS — BP 118/66 | HR 76 | Ht 68.0 in | Wt 179.0 lb

## 2023-06-03 DIAGNOSIS — R0609 Other forms of dyspnea: Secondary | ICD-10-CM | POA: Diagnosis not present

## 2023-06-03 DIAGNOSIS — R053 Chronic cough: Secondary | ICD-10-CM

## 2023-06-03 MED ORDER — METHYLPREDNISOLONE ACETATE 80 MG/ML IJ SUSP
120.0000 mg | Freq: Once | INTRAMUSCULAR | Status: AC
  Administered 2023-06-03: 120 mg via INTRAMUSCULAR

## 2023-06-03 NOTE — Patient Instructions (Signed)
The key to effective treatment for your cough is eliminating the non-stop cycle of cough you're stuck in long enough to let your airway heal completely and then see if there is anything still making you cough once you stop the cough suppression, but this should take no more than 3-5 days to figure out   Swallowing water or using ice chips/non mint and menthol containing candies (such as lifesavers or sugarless jolly ranchers) are also effective.  You should rest your voice and avoid activities that you know make you cough.  Hycodan cough syrup dosed adequately to suppress even the urge to cough   Depomedrol 120 mg IM   Call Dr Suszanne Conners for follow up and let me know if he is not willing to see you.

## 2023-06-03 NOTE — Assessment & Plan Note (Signed)
Never smoker with UC s/p viral URI Sept 2024 and coughing ever since  -  Allergy screen   05/21/23   >  Eos 0.2 /  IgE  26 -  cyclical cough rx with hyrdocodone, max gerd rx and depomedrol 120 mg IM  06/03/2023 >>>   Of the three most common causes of  Sub-acute / recurrent or chronic cough, only one (GERD)  can actually contribute to/ trigger  the other two (asthma and post nasal drip syndrome)  and perpetuate the cylce of cough.  While not intuitively obvious, many patients with chronic low grade reflux do not cough until there is a primary insult that disturbs the protective epithelial barrier and exposes sensitive nerve endings.   This is typically viral but can due to PNDS and  either may apply here.    >>>  The point is that once this occurs, it is difficult to eliminate the cycle  using anything but a maximally effective acid suppression regimen at least in the short run, accompanied by an appropriate diet to address non acid GERD and control / eliminate the cough itself for at least 3-5 days with hydrocodone cough syrup >>> also   added depomedrol 120 mg IM  in case of component of Th-2 driven upper or lower airways inflammation (if cough responds short term only to relapse before return while will on full rx for uacs (as above), then  that would point to allergic rhinitis/ asthma or eos bronchitis as alternative dx)   >>> If not better then ENT eval next and not she has seen [facility] before for hearing issues so should let him decide whether intereset in the nose.

## 2023-06-03 NOTE — Assessment & Plan Note (Signed)
Onset with viral uri Sept 2024  -  06/03/2023   Walked on  RA   x  3  lap(s) =  approx 450  ft  @ brisk pace, stopped due to end of study  with lowest 02 sats 95%    Each maintenance medication was reviewed in detail including emphasizing most importantly the difference between maintenance and prns and under what circumstances the prns are to be triggered using an action plan format where appropriate.  Total time for H and P, chart review, counseling,  directly observing portions of ambulatory 02 saturation study/ and generating customized AVS unique to this office visit / same day charting > 30 min for multiple  refractory respiratory  symptoms of uncertain etiology

## 2023-06-27 DIAGNOSIS — Z79899 Other long term (current) drug therapy: Secondary | ICD-10-CM | POA: Diagnosis not present

## 2023-07-10 ENCOUNTER — Ambulatory Visit (INDEPENDENT_AMBULATORY_CARE_PROVIDER_SITE_OTHER): Payer: 59

## 2023-09-04 ENCOUNTER — Telehealth (INDEPENDENT_AMBULATORY_CARE_PROVIDER_SITE_OTHER): Payer: Self-pay | Admitting: Otolaryngology

## 2023-09-04 NOTE — Telephone Encounter (Signed)
 Patient left vm to reschdule an appointment. Returned call, LVM for patient to return call.

## 2023-09-11 ENCOUNTER — Ambulatory Visit (INDEPENDENT_AMBULATORY_CARE_PROVIDER_SITE_OTHER)

## 2023-09-25 DIAGNOSIS — K5190 Ulcerative colitis, unspecified, without complications: Secondary | ICD-10-CM | POA: Diagnosis not present

## 2023-09-25 DIAGNOSIS — M79641 Pain in right hand: Secondary | ICD-10-CM | POA: Diagnosis not present

## 2023-09-25 DIAGNOSIS — M79672 Pain in left foot: Secondary | ICD-10-CM | POA: Diagnosis not present

## 2023-09-25 DIAGNOSIS — M810 Age-related osteoporosis without current pathological fracture: Secondary | ICD-10-CM | POA: Diagnosis not present

## 2023-09-25 DIAGNOSIS — M79642 Pain in left hand: Secondary | ICD-10-CM | POA: Diagnosis not present

## 2023-09-25 DIAGNOSIS — M25572 Pain in left ankle and joints of left foot: Secondary | ICD-10-CM | POA: Diagnosis not present

## 2023-09-25 DIAGNOSIS — E039 Hypothyroidism, unspecified: Secondary | ICD-10-CM | POA: Diagnosis not present

## 2023-09-25 DIAGNOSIS — M797 Fibromyalgia: Secondary | ICD-10-CM | POA: Diagnosis not present

## 2023-09-25 DIAGNOSIS — Z79899 Other long term (current) drug therapy: Secondary | ICD-10-CM | POA: Diagnosis not present

## 2023-09-25 DIAGNOSIS — M25571 Pain in right ankle and joints of right foot: Secondary | ICD-10-CM | POA: Diagnosis not present

## 2023-09-25 DIAGNOSIS — M1990 Unspecified osteoarthritis, unspecified site: Secondary | ICD-10-CM | POA: Diagnosis not present

## 2023-09-25 DIAGNOSIS — M79643 Pain in unspecified hand: Secondary | ICD-10-CM | POA: Diagnosis not present

## 2023-09-25 DIAGNOSIS — M4690 Unspecified inflammatory spondylopathy, site unspecified: Secondary | ICD-10-CM | POA: Diagnosis not present

## 2023-09-25 DIAGNOSIS — M79671 Pain in right foot: Secondary | ICD-10-CM | POA: Diagnosis not present

## 2023-09-25 DIAGNOSIS — M549 Dorsalgia, unspecified: Secondary | ICD-10-CM | POA: Diagnosis not present

## 2023-09-25 DIAGNOSIS — R5383 Other fatigue: Secondary | ICD-10-CM | POA: Diagnosis not present

## 2023-10-03 DIAGNOSIS — E782 Mixed hyperlipidemia: Secondary | ICD-10-CM | POA: Diagnosis not present

## 2023-10-03 DIAGNOSIS — Z6826 Body mass index (BMI) 26.0-26.9, adult: Secondary | ICD-10-CM | POA: Diagnosis not present

## 2023-10-03 DIAGNOSIS — K2100 Gastro-esophageal reflux disease with esophagitis, without bleeding: Secondary | ICD-10-CM | POA: Diagnosis not present

## 2023-10-03 DIAGNOSIS — K5190 Ulcerative colitis, unspecified, without complications: Secondary | ICD-10-CM | POA: Diagnosis not present

## 2023-10-03 DIAGNOSIS — F331 Major depressive disorder, recurrent, moderate: Secondary | ICD-10-CM | POA: Diagnosis not present

## 2023-10-03 DIAGNOSIS — Z0001 Encounter for general adult medical examination with abnormal findings: Secondary | ICD-10-CM | POA: Diagnosis not present

## 2023-10-10 DIAGNOSIS — L905 Scar conditions and fibrosis of skin: Secondary | ICD-10-CM | POA: Diagnosis not present

## 2023-10-10 DIAGNOSIS — L821 Other seborrheic keratosis: Secondary | ICD-10-CM | POA: Diagnosis not present

## 2023-10-10 DIAGNOSIS — L820 Inflamed seborrheic keratosis: Secondary | ICD-10-CM | POA: Diagnosis not present

## 2023-10-10 DIAGNOSIS — D1801 Hemangioma of skin and subcutaneous tissue: Secondary | ICD-10-CM | POA: Diagnosis not present

## 2023-10-10 DIAGNOSIS — D2262 Melanocytic nevi of left upper limb, including shoulder: Secondary | ICD-10-CM | POA: Diagnosis not present

## 2023-10-10 DIAGNOSIS — L814 Other melanin hyperpigmentation: Secondary | ICD-10-CM | POA: Diagnosis not present

## 2023-10-10 DIAGNOSIS — Z85828 Personal history of other malignant neoplasm of skin: Secondary | ICD-10-CM | POA: Diagnosis not present

## 2023-10-17 DIAGNOSIS — Z1212 Encounter for screening for malignant neoplasm of rectum: Secondary | ICD-10-CM | POA: Diagnosis not present

## 2023-10-17 DIAGNOSIS — Z1211 Encounter for screening for malignant neoplasm of colon: Secondary | ICD-10-CM | POA: Diagnosis not present

## 2023-11-01 ENCOUNTER — Ambulatory Visit (INDEPENDENT_AMBULATORY_CARE_PROVIDER_SITE_OTHER): Payer: BC Managed Care – PPO | Admitting: Otolaryngology

## 2023-11-05 NOTE — Therapy (Incomplete)
 OUTPATIENT PHYSICAL THERAPY LOWER EXTREMITY EVALUATION   Patient Name: Robin Gardner MRN: [id number] DOB:05-15-1965, 58 y.o., female Today's Date: 11/05/2023  END OF SESSION:   Past Medical History:  Diagnosis Date   Anxiety    Arthritis    psoriatic - RA   Depression    Essential hypertension, benign 05/20/2013   Fibromyalgia 2020   GERD (gastroesophageal reflux disease)    Hypothyroidism    Seasonal allergies    Skin cancer    Ulcerative colitis (HCC)    resolved in 2018   Past Surgical History:  Procedure Laterality Date   ABDOMINAL HYSTERECTOMY     AIKEN OSTEOTOMY Right 11/27/2012   Procedure: KATRINA OSTEOTOMY RIGHT FOOT;  Surgeon: Morene Donley Anon, DPM;  Location: [facility];  Service: Orthopedics;  Laterality: Right;   KATRINA OSTEOTOMY Left 04/23/2013   Procedure: KATRINA OSTEOTOMY;  Surgeon: Morene Donley Anon, DPM;  Location: [facility];  Service: Orthopedics;  Laterality: Left;   BLADDER SUSPENSION  2012   BUNIONECTOMY Left 04/23/2013   Procedure: MASSIE EDWARDS;  Surgeon: Morene Donley Anon, DPM;  Location: [facility];  Service: Orthopedics;  Laterality: Left;   CESAREAN SECTION  2009   CHOLECYSTECTOMY N/A 03/24/2020   Procedure: LAPAROSCOPIC CHOLECYSTECTOMY WITH INTRAOPERATIVE CHOLANGIOGRAM;  Surgeon: Tanda Locus, MD;  Location: [facility];  Service: General;  Laterality: N/A;   COLONOSCOPY     DILATION AND CURETTAGE OF UTERUS     EYE SURGERY Bilateral 2020,2020   cataract   OSTECTOMY Right 11/27/2012   Procedure: LUDLOFF OSTEOTOMY RIGHT FOOT;  Surgeon: Morene Donley Anon, DPM;  Location: [facility];  Service: Orthopedics;  Laterality: Right;   Patient Active Problem List   Diagnosis Date Noted   DOE (dyspnea on exertion) 06/03/2023   Chronic cough 05/22/2023   Asthma 05/21/2023   Hypertensive disorder 05/21/2023   Pulmonary infiltrates on CXR 05/21/2023   Posterior vitreous detachment, both eyes 07/05/2020   Vitreous floaters of both eyes 07/05/2020    Hyperlipidemia LDL goal <100 08/20/2017   Ulcerative colitis (HCC) 10/05/2013   Polyarthritis 10/05/2013   Depression 10/05/2013   Essential hypertension, benign 05/20/2013   GERD (gastroesophageal reflux disease) 11/09/2012   Hypothyroidism 11/09/2012    PCP: ***  REFERRING PROVIDER: ***  REFERRING DIAG: ***  THERAPY DIAG:  No diagnosis found.  Rationale for Evaluation and Treatment: {HABREHAB:27488}  ONSET DATE: ***  SUBJECTIVE:   SUBJECTIVE STATEMENT: ***  PERTINENT HISTORY: *** PAIN:  Are you having pain? {OPRCPAIN:27236}  PRECAUTIONS: {Therapy precautions:24002}  RED FLAGS: {PT Red Flags:29287}   WEIGHT BEARING RESTRICTIONS: {Yes ***/No:24003}  FALLS:  Has patient fallen in last 6 months? {fallsyesno:27318}  LIVING ENVIRONMENT: Lives with: {OPRC lives with:25569::lives with their family} Lives in: {Lives in:25570} Stairs: {opstairs:27293} Has following equipment at home: {Assistive devices:23999}  OCCUPATION: ***  PLOF: {PLOF:24004}  PATIENT GOALS: ***  NEXT MD VISIT: ***  OBJECTIVE:  Note: Objective measures were completed at Evaluation unless otherwise noted.  DIAGNOSTIC FINDINGS: ***  PATIENT SURVEYS:  {rehab surveys:24030}  COGNITION: Overall cognitive status: {cognition:24006}     SENSATION: {sensation:27233}  EDEMA:  {edema:24020}  MUSCLE LENGTH: Hamstrings: Right *** deg; Left *** deg Debby test: Right *** deg; Left *** deg  POSTURE: {posture:25561}  PALPATION: ***  LOWER EXTREMITY ROM:  {AROM/PROM:27142} ROM Right eval Left eval  Hip flexion    Hip extension    Hip abduction    Hip adduction    Hip internal rotation    Hip external rotation    Knee flexion  Knee extension    Ankle dorsiflexion    Ankle plantarflexion    Ankle inversion    Ankle eversion     (Blank rows = not tested)  LOWER EXTREMITY MMT:  MMT Right eval Left eval  Hip flexion    Hip extension    Hip abduction    Hip  adduction    Hip internal rotation    Hip external rotation    Knee flexion    Knee extension    Ankle dorsiflexion    Ankle plantarflexion    Ankle inversion    Ankle eversion     (Blank rows = not tested)  LOWER EXTREMITY SPECIAL TESTS:  {LEspecialtests:26242}  FUNCTIONAL TESTS:  {Functional tests:24029}  GAIT: Distance walked: *** Assistive device utilized: {Assistive devices:23999} Level of assistance: {Levels of assistance:24026} Comments: ***                                                                                                                                TREATMENT DATE: ***    PATIENT EDUCATION:  Education details: *** Person educated: {Person educated:25204} Education method: {Education Method:25205} Education comprehension: {Education Comprehension:25206}  HOME EXERCISE PROGRAM: ***  ASSESSMENT:  CLINICAL IMPRESSION: Patient is a *** y.o. *** who was seen today for physical therapy evaluation and treatment for ***.   OBJECTIVE IMPAIRMENTS: {opptimpairments:25111}.   ACTIVITY LIMITATIONS: {activitylimitations:27494}  PARTICIPATION LIMITATIONS: {participationrestrictions:25113}  PERSONAL FACTORS: {Personal factors:25162} are also affecting patient's functional outcome.   REHAB POTENTIAL: {rehabpotential:25112}  CLINICAL DECISION MAKING: {clinical decision making:25114}  EVALUATION COMPLEXITY: {Evaluation complexity:25115}   GOALS: Goals reviewed with patient? {yes/no:20286}  SHORT TERM GOALS: Target date: *** *** Baseline: Goal status: INITIAL  2.  *** Baseline:  Goal status: INITIAL  3.  *** Baseline:  Goal status: INITIAL  4.  *** Baseline:  Goal status: INITIAL  5.  *** Baseline:  Goal status: INITIAL  6.  *** Baseline:  Goal status: INITIAL  LONG TERM GOALS: Target date: ***  *** Baseline:  Goal status: INITIAL  2.  *** Baseline:  Goal status: INITIAL  3.  *** Baseline:  Goal status: INITIAL  4.   *** Baseline:  Goal status: INITIAL  5.  *** Baseline:  Goal status: INITIAL  6.  *** Baseline:  Goal status: INITIAL   PLAN:  PT FREQUENCY: {rehab frequency:25116}  PT DURATION: {rehab duration:25117}  PLANNED INTERVENTIONS: {rehab planned interventions:25118::97110-Therapeutic exercises,97530- Therapeutic [phone]- Neuromuscular re-education,97535- Self Rjmz,02859- Manual therapy}  PLAN FOR NEXT SESSION: ***   Greig GORMAN Quivers, PT 11/05/2023, 2:48 PM

## 2023-11-06 ENCOUNTER — Ambulatory Visit (HOSPITAL_COMMUNITY)

## 2023-11-06 ENCOUNTER — Telehealth (HOSPITAL_COMMUNITY): Payer: Self-pay

## 2023-11-06 NOTE — Telephone Encounter (Signed)
 Left message for patient regarding missed appointment for physical therapy evaluation today.  Gave callback number if she would like to reschedule.    12:55 PM, 11/06/23 Shawniece Oyola Small Amaiya Scruton MPT [facility] [place] [phone]

## 2023-11-07 DIAGNOSIS — Z8719 Personal history of other diseases of the digestive system: Secondary | ICD-10-CM | POA: Diagnosis not present

## 2023-11-07 DIAGNOSIS — R195 Other fecal abnormalities: Secondary | ICD-10-CM | POA: Diagnosis not present

## 2023-11-21 DIAGNOSIS — Z85828 Personal history of other malignant neoplasm of skin: Secondary | ICD-10-CM | POA: Diagnosis not present

## 2023-11-21 DIAGNOSIS — C44629 Squamous cell carcinoma of skin of left upper limb, including shoulder: Secondary | ICD-10-CM | POA: Diagnosis not present

## 2023-12-25 DIAGNOSIS — R195 Other fecal abnormalities: Secondary | ICD-10-CM | POA: Diagnosis not present

## 2023-12-25 DIAGNOSIS — K5289 Other specified noninfective gastroenteritis and colitis: Secondary | ICD-10-CM | POA: Diagnosis not present

## 2023-12-25 DIAGNOSIS — Z1211 Encounter for screening for malignant neoplasm of colon: Secondary | ICD-10-CM | POA: Diagnosis not present

## 2023-12-25 DIAGNOSIS — K648 Other hemorrhoids: Secondary | ICD-10-CM | POA: Diagnosis not present

## 2023-12-25 DIAGNOSIS — K5150 Left sided colitis without complications: Secondary | ICD-10-CM | POA: Diagnosis not present

## 2023-12-25 DIAGNOSIS — D123 Benign neoplasm of transverse colon: Secondary | ICD-10-CM | POA: Diagnosis not present

## 2023-12-25 DIAGNOSIS — K644 Residual hemorrhoidal skin tags: Secondary | ICD-10-CM | POA: Diagnosis not present

## 2024-01-02 DIAGNOSIS — K5190 Ulcerative colitis, unspecified, without complications: Secondary | ICD-10-CM | POA: Diagnosis not present

## 2024-01-02 DIAGNOSIS — M6281 Muscle weakness (generalized): Secondary | ICD-10-CM | POA: Diagnosis not present

## 2024-01-02 DIAGNOSIS — M1990 Unspecified osteoarthritis, unspecified site: Secondary | ICD-10-CM | POA: Diagnosis not present

## 2024-01-02 DIAGNOSIS — Z79899 Other long term (current) drug therapy: Secondary | ICD-10-CM | POA: Diagnosis not present

## 2024-01-02 DIAGNOSIS — M719 Bursopathy, unspecified: Secondary | ICD-10-CM | POA: Diagnosis not present

## 2024-01-02 DIAGNOSIS — M4690 Unspecified inflammatory spondylopathy, site unspecified: Secondary | ICD-10-CM | POA: Diagnosis not present

## 2024-02-12 DIAGNOSIS — R051 Acute cough: Secondary | ICD-10-CM | POA: Diagnosis not present

## 2024-02-12 DIAGNOSIS — J209 Acute bronchitis, unspecified: Secondary | ICD-10-CM | POA: Diagnosis not present

## 2024-02-12 DIAGNOSIS — J019 Acute sinusitis, unspecified: Secondary | ICD-10-CM | POA: Diagnosis not present

## 2024-02-12 DIAGNOSIS — Z20828 Contact with and (suspected) exposure to other viral communicable diseases: Secondary | ICD-10-CM | POA: Diagnosis not present

## 2024-02-12 DIAGNOSIS — Z2089 Contact with and (suspected) exposure to other communicable diseases: Secondary | ICD-10-CM | POA: Diagnosis not present

## 2024-02-12 DIAGNOSIS — Z6825 Body mass index (BMI) 25.0-25.9, adult: Secondary | ICD-10-CM | POA: Diagnosis not present

## 2024-04-07 DIAGNOSIS — J0101 Acute recurrent maxillary sinusitis: Secondary | ICD-10-CM | POA: Diagnosis not present
